# Patient Record
Sex: Female | Born: 1975
Health system: Southern US, Community
[De-identification: ages and names within clinical notes are randomized; demographics above are authoritative.]

## PROBLEM LIST (undated history)

## (undated) DIAGNOSIS — F17200 Nicotine dependence, unspecified, uncomplicated: Secondary | ICD-10-CM

## (undated) DIAGNOSIS — A64 Unspecified sexually transmitted disease: Secondary | ICD-10-CM

## (undated) DIAGNOSIS — G43909 Migraine, unspecified, not intractable, without status migrainosus: Secondary | ICD-10-CM

## (undated) DIAGNOSIS — A749 Chlamydial infection, unspecified: Secondary | ICD-10-CM

## (undated) DIAGNOSIS — IMO0002 Reserved for concepts with insufficient information to code with codable children: Secondary | ICD-10-CM

## (undated) DIAGNOSIS — J45909 Unspecified asthma, uncomplicated: Secondary | ICD-10-CM

## (undated) HISTORY — DX: Nicotine dependence, unspecified, uncomplicated: F17.200

## (undated) HISTORY — PX: TONSILLECTOMY AND ADENOIDECTOMY: SUR1326

## (undated) HISTORY — DX: Unspecified sexually transmitted disease: A64

## (undated) HISTORY — DX: Chlamydial infection, unspecified: A74.9

## (undated) HISTORY — PX: SHOULDER SURGERY: SHX246

## (undated) HISTORY — DX: Reserved for concepts with insufficient information to code with codable children: IMO0002

## (undated) HISTORY — PX: CERVICAL BIOPSY  W/ LOOP ELECTRODE EXCISION: SUR135

---

## 2000-05-19 HISTORY — PX: LEEP: SHX91

## 2003-05-31 ENCOUNTER — Other Ambulatory Visit: Admission: RE | Admit: 2003-05-31 | Discharge: 2003-05-31 | Payer: Self-pay | Admitting: Obstetrics and Gynecology

## 2003-12-06 ENCOUNTER — Other Ambulatory Visit: Admission: RE | Admit: 2003-12-06 | Discharge: 2003-12-06 | Payer: Self-pay | Admitting: Gynecology

## 2004-02-03 ENCOUNTER — Emergency Department (HOSPITAL_COMMUNITY): Admission: EM | Admit: 2004-02-03 | Discharge: 2004-02-03 | Payer: Self-pay | Admitting: Family Medicine

## 2004-12-06 ENCOUNTER — Other Ambulatory Visit: Admission: RE | Admit: 2004-12-06 | Discharge: 2004-12-06 | Payer: Self-pay | Admitting: Gynecology

## 2005-03-31 ENCOUNTER — Emergency Department (HOSPITAL_COMMUNITY): Admission: EM | Admit: 2005-03-31 | Discharge: 2005-03-31 | Payer: Self-pay | Admitting: Family Medicine

## 2005-12-10 ENCOUNTER — Other Ambulatory Visit: Admission: RE | Admit: 2005-12-10 | Discharge: 2005-12-10 | Payer: Self-pay | Admitting: Obstetrics and Gynecology

## 2006-04-03 ENCOUNTER — Emergency Department (HOSPITAL_COMMUNITY): Admission: EM | Admit: 2006-04-03 | Discharge: 2006-04-03 | Payer: Self-pay | Admitting: Family Medicine

## 2006-12-17 ENCOUNTER — Other Ambulatory Visit: Admission: RE | Admit: 2006-12-17 | Discharge: 2006-12-17 | Payer: Self-pay | Admitting: Gynecology

## 2007-07-07 ENCOUNTER — Emergency Department (HOSPITAL_COMMUNITY): Admission: EM | Admit: 2007-07-07 | Discharge: 2007-07-07 | Payer: Self-pay | Admitting: Emergency Medicine

## 2009-07-21 ENCOUNTER — Emergency Department (HOSPITAL_COMMUNITY): Admission: EM | Admit: 2009-07-21 | Discharge: 2009-07-21 | Payer: Self-pay | Admitting: Family Medicine

## 2009-10-17 ENCOUNTER — Other Ambulatory Visit: Admission: RE | Admit: 2009-10-17 | Discharge: 2009-10-17 | Payer: Self-pay | Admitting: Gynecology

## 2009-10-17 ENCOUNTER — Ambulatory Visit: Payer: Self-pay | Admitting: Gynecology

## 2009-10-17 DIAGNOSIS — A749 Chlamydial infection, unspecified: Secondary | ICD-10-CM

## 2009-10-17 HISTORY — DX: Chlamydial infection, unspecified: A74.9

## 2009-12-06 ENCOUNTER — Ambulatory Visit: Payer: Self-pay | Admitting: Gynecology

## 2009-12-17 HISTORY — PX: INTRAUTERINE DEVICE INSERTION: SHX323

## 2010-01-01 ENCOUNTER — Ambulatory Visit: Payer: Self-pay | Admitting: Gynecology

## 2010-01-04 ENCOUNTER — Ambulatory Visit: Payer: Self-pay | Admitting: Gynecology

## 2010-04-06 ENCOUNTER — Emergency Department (HOSPITAL_COMMUNITY): Admission: EM | Admit: 2010-04-06 | Discharge: 2010-04-06 | Payer: Self-pay | Admitting: Emergency Medicine

## 2010-09-16 ENCOUNTER — Ambulatory Visit (INDEPENDENT_AMBULATORY_CARE_PROVIDER_SITE_OTHER): Payer: BC Managed Care – PPO | Admitting: Gynecology

## 2010-09-16 DIAGNOSIS — N898 Other specified noninflammatory disorders of vagina: Secondary | ICD-10-CM

## 2010-09-16 DIAGNOSIS — B373 Candidiasis of vulva and vagina: Secondary | ICD-10-CM

## 2010-09-16 DIAGNOSIS — Z113 Encounter for screening for infections with a predominantly sexual mode of transmission: Secondary | ICD-10-CM

## 2010-10-02 ENCOUNTER — Inpatient Hospital Stay (INDEPENDENT_AMBULATORY_CARE_PROVIDER_SITE_OTHER)
Admission: RE | Admit: 2010-10-02 | Discharge: 2010-10-02 | Disposition: A | Discharge status: Home / Self Care | Payer: BC Managed Care – PPO | Source: Ambulatory Visit | Attending: Emergency Medicine | Admitting: Emergency Medicine

## 2010-10-02 DIAGNOSIS — N390 Urinary tract infection, site not specified: Secondary | ICD-10-CM

## 2010-10-02 LAB — POCT URINALYSIS DIP (DEVICE)
Bilirubin Urine: NEGATIVE
Ketones, ur: NEGATIVE mg/dL
pH: 6.5 (ref 5.0–8.0)

## 2010-10-02 LAB — WET PREP, GENITAL: Trich, Wet Prep: NONE SEEN

## 2010-10-03 LAB — GC/CHLAMYDIA PROBE AMP, GENITAL
Chlamydia, DNA Probe: NEGATIVE
GC Probe Amp, Genital: NEGATIVE

## 2010-10-05 LAB — URINE CULTURE: Culture  Setup Time: 201205161800

## 2010-11-04 ENCOUNTER — Other Ambulatory Visit: Payer: Self-pay | Admitting: Gynecology

## 2010-11-04 ENCOUNTER — Other Ambulatory Visit (HOSPITAL_COMMUNITY)
Admission: RE | Admit: 2010-11-04 | Discharge: 2010-11-04 | Disposition: A | Discharge status: Home / Self Care | Payer: BC Managed Care – PPO | Source: Ambulatory Visit | Attending: Gynecology | Admitting: Gynecology

## 2010-11-04 ENCOUNTER — Encounter (INDEPENDENT_AMBULATORY_CARE_PROVIDER_SITE_OTHER): Payer: BC Managed Care – PPO | Admitting: Gynecology

## 2010-11-04 DIAGNOSIS — Z13220 Encounter for screening for lipoid disorders: Secondary | ICD-10-CM

## 2010-11-04 DIAGNOSIS — R82998 Other abnormal findings in urine: Secondary | ICD-10-CM

## 2010-11-04 DIAGNOSIS — Z124 Encounter for screening for malignant neoplasm of cervix: Secondary | ICD-10-CM | POA: Insufficient documentation

## 2010-11-04 DIAGNOSIS — Z01419 Encounter for gynecological examination (general) (routine) without abnormal findings: Secondary | ICD-10-CM

## 2010-11-17 DIAGNOSIS — IMO0002 Reserved for concepts with insufficient information to code with codable children: Secondary | ICD-10-CM

## 2010-11-17 HISTORY — DX: Reserved for concepts with insufficient information to code with codable children: IMO0002

## 2010-11-17 HISTORY — PX: COLPOSCOPY: SHX161

## 2010-11-19 ENCOUNTER — Other Ambulatory Visit: Payer: Self-pay | Admitting: Gynecology

## 2010-11-19 ENCOUNTER — Ambulatory Visit (INDEPENDENT_AMBULATORY_CARE_PROVIDER_SITE_OTHER): Payer: BC Managed Care – PPO | Admitting: Gynecology

## 2010-11-19 DIAGNOSIS — N870 Mild cervical dysplasia: Secondary | ICD-10-CM

## 2011-01-08 ENCOUNTER — Ambulatory Visit (INDEPENDENT_AMBULATORY_CARE_PROVIDER_SITE_OTHER): Payer: BC Managed Care – PPO | Admitting: Gynecology

## 2011-01-08 ENCOUNTER — Encounter: Payer: Self-pay | Admitting: Gynecology

## 2011-01-08 DIAGNOSIS — B373 Candidiasis of vulva and vagina: Secondary | ICD-10-CM

## 2011-01-08 DIAGNOSIS — F17200 Nicotine dependence, unspecified, uncomplicated: Secondary | ICD-10-CM | POA: Insufficient documentation

## 2011-01-08 DIAGNOSIS — A499 Bacterial infection, unspecified: Secondary | ICD-10-CM

## 2011-01-08 DIAGNOSIS — R82998 Other abnormal findings in urine: Secondary | ICD-10-CM

## 2011-01-08 DIAGNOSIS — B9689 Other specified bacterial agents as the cause of diseases classified elsewhere: Secondary | ICD-10-CM

## 2011-01-08 DIAGNOSIS — R8290 Unspecified abnormal findings in urine: Secondary | ICD-10-CM

## 2011-01-08 DIAGNOSIS — N879 Dysplasia of cervix uteri, unspecified: Secondary | ICD-10-CM | POA: Insufficient documentation

## 2011-01-08 DIAGNOSIS — B3731 Acute candidiasis of vulva and vagina: Secondary | ICD-10-CM

## 2011-01-08 DIAGNOSIS — N898 Other specified noninflammatory disorders of vagina: Secondary | ICD-10-CM

## 2011-01-08 DIAGNOSIS — N760 Acute vaginitis: Secondary | ICD-10-CM

## 2011-01-08 MED ORDER — METRONIDAZOLE 500 MG PO TABS: 500.0000 mg | ORAL_TABLET | Freq: Two times a day (BID) | ORAL | Status: AC

## 2011-01-08 MED ORDER — FLUCONAZOLE 200 MG PO TABS: 200.0000 mg | ORAL_TABLET | Freq: Every day | ORAL | Status: AC

## 2011-01-08 NOTE — Progress Notes (Signed)
Patient presents with a one-week history of vaginal discharge odor also some odor in her urine. She took one Diflucan that she had at home and it did not seem to help her. No fevers chills or constitutional symptoms no frequency dysuria. Does not feel she is at risk for STDs and does not want screened.  Exam Abdomen: Soft nontender without masses guarding rebound organomegaly Pelvic: External BUS vagina White discharge KOH wet prep done, cervix normal IUD string visualized, uterus normal size midline mobile nontender adnexa without masses or tenderness  Assessment and plan: #1 Vaginal discharge. KOH wet prep is positive for yeast and BV we'll treat with Diflucan 200 daily for 3 days and Flagyl 500 twice a day x7 days. Follow up if symptoms persist or recur. #2 Urinary odor. We'll check urinalysis and culture. Followup and treat according to results.

## 2011-01-09 ENCOUNTER — Other Ambulatory Visit: Payer: Self-pay | Admitting: *Deleted

## 2011-01-09 ENCOUNTER — Other Ambulatory Visit: Payer: BC Managed Care – PPO | Admitting: *Deleted

## 2011-01-09 DIAGNOSIS — R8271 Bacteriuria: Secondary | ICD-10-CM

## 2011-01-14 ENCOUNTER — Telehealth: Payer: Self-pay | Admitting: *Deleted

## 2011-01-14 DIAGNOSIS — N390 Urinary tract infection, site not specified: Secondary | ICD-10-CM

## 2011-01-14 MED ORDER — SULFAMETHOXAZOLE-TRIMETHOPRIM 800-160 MG PO TABS: 1.0000 | ORAL_TABLET | Freq: Two times a day (BID) | ORAL | Status: AC

## 2011-01-14 NOTE — Telephone Encounter (Signed)
PT C/O UTI S/S LOWER BACK PAIN, LOW GRADE FEVER, FOUL URINE SMELL. PT WOULD LIKE RX TO RELIEVE. PLEASE ADVISE,

## 2011-01-14 NOTE — Telephone Encounter (Signed)
Pt informed of rx & to make appointment if s/s continue.

## 2011-01-14 NOTE — Telephone Encounter (Signed)
Septra DS 1 po bid X 3.  OV if S/S persist

## 2011-01-27 ENCOUNTER — Telehealth: Payer: Self-pay | Admitting: *Deleted

## 2011-01-27 NOTE — Telephone Encounter (Signed)
Pt calling c/o uti s/s lower back pain, frequent urination, slight burning and slight odor. Pt was given rx on 01/11/11 for uti and told office visit if s/s persist. Office visit offered she states she can't come in due to her work schedule. Pt would like rx for uti. Pt also states she would like something for bladder spasms. I told pt that im not sure if she would get something for that. Please advise.

## 2011-01-27 NOTE — Telephone Encounter (Signed)
Lm on pt vm with the below note. 

## 2011-01-27 NOTE — Telephone Encounter (Signed)
Please call pt ov best to check uti  Or vag infection.  thanks

## 2011-02-23 ENCOUNTER — Inpatient Hospital Stay (INDEPENDENT_AMBULATORY_CARE_PROVIDER_SITE_OTHER)
Admission: RE | Admit: 2011-02-23 | Discharge: 2011-02-23 | Disposition: A | Discharge status: Home / Self Care | Payer: BC Managed Care – PPO | Source: Ambulatory Visit | Attending: Family Medicine | Admitting: Family Medicine

## 2011-02-23 DIAGNOSIS — N390 Urinary tract infection, site not specified: Secondary | ICD-10-CM

## 2011-02-23 LAB — POCT URINALYSIS DIP (DEVICE)
Glucose, UA: NEGATIVE mg/dL
Ketones, ur: NEGATIVE mg/dL
Specific Gravity, Urine: 1.025 (ref 1.005–1.030)
Urobilinogen, UA: 0.2 mg/dL (ref 0.0–1.0)

## 2011-02-23 LAB — POCT PREGNANCY, URINE: Preg Test, Ur: NEGATIVE

## 2011-03-19 ENCOUNTER — Ambulatory Visit (INDEPENDENT_AMBULATORY_CARE_PROVIDER_SITE_OTHER): Payer: BC Managed Care – PPO | Admitting: Gynecology

## 2011-03-19 ENCOUNTER — Encounter: Payer: Self-pay | Admitting: Gynecology

## 2011-03-19 DIAGNOSIS — B373 Candidiasis of vulva and vagina: Secondary | ICD-10-CM

## 2011-03-19 DIAGNOSIS — R350 Frequency of micturition: Secondary | ICD-10-CM

## 2011-03-19 DIAGNOSIS — N898 Other specified noninflammatory disorders of vagina: Secondary | ICD-10-CM

## 2011-03-19 DIAGNOSIS — N760 Acute vaginitis: Secondary | ICD-10-CM

## 2011-03-19 DIAGNOSIS — A499 Bacterial infection, unspecified: Secondary | ICD-10-CM

## 2011-03-19 MED ORDER — METRONIDAZOLE 500 MG PO TABS: 500.0000 mg | ORAL_TABLET | Freq: Two times a day (BID) | ORAL | Status: AC

## 2011-03-19 MED ORDER — TERCONAZOLE 0.8 % VA CREA: 1.0000 | TOPICAL_CREAM | Freq: Every day | VAGINAL | Status: AC

## 2011-03-19 NOTE — Patient Instructions (Signed)
Take medications as prescribed

## 2011-03-19 NOTE — Progress Notes (Signed)
Patient presents complaining of a vaginal discharge and odor some low back pain and urinary frequency. Was treated in August for BV and yeast symptoms got better.  Exam abdomen soft nontender without masses guarding rebound organomegaly Pelvic external BUS vagina with frothy white discharge KOH wet prep done bimanual without masses or tenderness  Assessment and plan: Wet prep is positive for yeast and BV we'll treat with Terazol 3 day cream, Flagyl 500 twice a day x7 days alcohol avoidance, follow up if symptoms recur. Her UA is negative.

## 2011-04-19 ENCOUNTER — Emergency Department (HOSPITAL_COMMUNITY)
Admission: EM | Admit: 2011-04-19 | Discharge: 2011-04-19 | Disposition: A | Discharge status: Other Acute Inpatient Hospital | Payer: BC Managed Care – PPO | Source: Home / Self Care

## 2011-04-19 ENCOUNTER — Encounter (HOSPITAL_COMMUNITY): Payer: Self-pay | Admitting: Emergency Medicine

## 2011-04-19 DIAGNOSIS — J019 Acute sinusitis, unspecified: Secondary | ICD-10-CM

## 2011-04-19 MED ORDER — AMOXICILLIN-POT CLAVULANATE 875-125 MG PO TABS: 1.0000 | ORAL_TABLET | Freq: Two times a day (BID) | ORAL | Status: AC

## 2011-04-19 NOTE — ED Notes (Signed)
Congestion, cough, fever, pressure in chest for 6 days. Progressively worsening.

## 2011-04-19 NOTE — ED Provider Notes (Signed)
History     CSN: [id number] Arrival date & time: 04/19/2011  3:30 PM   None     Chief Complaint  Patient presents with  . Nasal Congestion    (Consider location/radiation/quality/duration/timing/severity/associated sxs/prior treatment) HPI Comments: Pt states symptoms began 8 days ago. Was like a cold at onset with nasal congestion and sneezing. Symptoms have worsened and in last 2-3 days has developed sinus pressure and thick yellow mucus. Fever began yesterday. Minimal cough and feels like is due to post nasal drainage. No chest congestion. Has been taking over the counter cold medications without improvement.   Patient is a 35 y.o. female presenting with sinusitis. The history is provided by the patient.  Sinusitis  This is a new problem. The current episode started more than 1 week ago. The problem has been gradually worsening. The maximum temperature recorded prior to her arrival was 101 to 101.9 F. The fever has been present for 1 to 2 days. The pain is mild. Associated symptoms include chills, congestion, sinus pressure and cough (mild cough, due to post nasal drainage). Pertinent negatives include no ear pain, no sore throat, no swollen glands and no shortness of breath.    Past Medical History  Diagnosis Date  . Chlamydia 10/2009  . Smoker   . LGSIL (low grade squamous intraepithelial dysplasia) 11/2009    Past Surgical History  Procedure Date  . Tonsillectomy and adenoidectomy   . Leep 2002  . Intrauterine device insertion 12/2009  . Colposcopy 11/2010    Family History  Problem Relation Age of Onset  . Diabetes Mother   . Hypertension Mother   . Diabetes Father   . Diabetes Paternal Grandmother   . Hypertension Paternal Grandmother     History  Substance Use Topics  . Smoking status: Current Everyday Smoker -- 0.5 packs/day  . Smokeless tobacco: Never Used  . Alcohol Use: Yes     occassionally    OB History    Grav Para Term Preterm Abortions TAB SAB Ect  Mult Living   1 1              Review of Systems  Constitutional: Positive for fever and chills. Negative for fatigue.  HENT: Positive for congestion, postnasal drip and sinus pressure. Negative for ear pain, sore throat and rhinorrhea.   Respiratory: Positive for cough (mild cough, due to post nasal drainage). Negative for shortness of breath.   Cardiovascular: Negative for chest pain.    Allergies  Review of patient's allergies indicates no known allergies.  Home Medications   Current Outpatient Rx  Name Route Sig Dispense Refill  . AMOXICILLIN-POT CLAVULANATE 875-125 MG PO TABS Oral Take 1 tablet by mouth 2 (two) times daily. 20 tablet 0  . LEVONORGESTREL 20 MCG/24HR IU IUD Intrauterine 1 each by Intrauterine route once. Inserted 01/01/10     . ONE-DAILY MULTI VITAMINS PO TABS Oral Take 1 tablet by mouth daily.        BP 112/68  Pulse 62  Temp(Src) 98.6 F (37 C) (Oral)  Resp 17  SpO2 99%  Physical Exam  Nursing note and vitals reviewed. Constitutional: She appears well-developed and well-nourished. No distress.  HENT:  Head: Normocephalic and atraumatic.  Right Ear: Tympanic membrane, external ear and ear canal normal.  Left Ear: Tympanic membrane, external ear and ear canal normal.  Nose: Mucosal edema present. Right sinus exhibits maxillary sinus tenderness and frontal sinus tenderness. Left sinus exhibits maxillary sinus tenderness and frontal sinus tenderness.  Mouth/Throat: Uvula is midline, oropharynx is clear and moist and mucous membranes are normal. No oropharyngeal exudate, posterior oropharyngeal edema or posterior oropharyngeal erythema.  Neck: Neck supple.  Cardiovascular: Normal rate, regular rhythm and normal heart sounds.   Pulmonary/Chest: Effort normal and breath sounds normal. No respiratory distress.  Lymphadenopathy:    She has no cervical adenopathy.  Neurological: She is alert.  Skin: Skin is warm and dry.  Psychiatric: She has a normal mood  and affect.    ED Course  Procedures (including critical care time)  Labs Reviewed - No data to display No results found.   1. Sinusitis acute       MDM          Melody Comas, PA 04/19/11 1552

## 2011-04-25 NOTE — ED Provider Notes (Signed)
Medical screening examination/treatment/procedure(s) were performed by non-physician practitioner and as supervising physician I was immediately available for consultation/collaboration.  Luiz Blare MD   Luiz Blare, MD 04/25/11 [phone]

## 2011-06-04 ENCOUNTER — Encounter: Payer: Self-pay | Admitting: Gynecology

## 2011-06-04 ENCOUNTER — Ambulatory Visit (INDEPENDENT_AMBULATORY_CARE_PROVIDER_SITE_OTHER): Payer: BC Managed Care – PPO | Admitting: Gynecology

## 2011-06-04 ENCOUNTER — Other Ambulatory Visit (HOSPITAL_COMMUNITY)
Admission: RE | Admit: 2011-06-04 | Discharge: 2011-06-04 | Disposition: A | Discharge status: Home / Self Care | Payer: BC Managed Care – PPO | Source: Ambulatory Visit | Attending: Gynecology | Admitting: Gynecology

## 2011-06-04 DIAGNOSIS — Z01419 Encounter for gynecological examination (general) (routine) without abnormal findings: Secondary | ICD-10-CM | POA: Insufficient documentation

## 2011-06-04 DIAGNOSIS — L293 Anogenital pruritus, unspecified: Secondary | ICD-10-CM

## 2011-06-04 DIAGNOSIS — N949 Unspecified condition associated with female genital organs and menstrual cycle: Secondary | ICD-10-CM

## 2011-06-04 DIAGNOSIS — L292 Pruritus vulvae: Secondary | ICD-10-CM

## 2011-06-04 DIAGNOSIS — N898 Other specified noninflammatory disorders of vagina: Secondary | ICD-10-CM

## 2011-06-04 DIAGNOSIS — N870 Mild cervical dysplasia: Secondary | ICD-10-CM

## 2011-06-04 DIAGNOSIS — Z30431 Encounter for routine checking of intrauterine contraceptive device: Secondary | ICD-10-CM

## 2011-06-04 DIAGNOSIS — R102 Pelvic and perineal pain: Secondary | ICD-10-CM

## 2011-06-04 DIAGNOSIS — B373 Candidiasis of vulva and vagina: Secondary | ICD-10-CM

## 2011-06-04 LAB — URINALYSIS W MICROSCOPIC + REFLEX CULTURE
Hgb urine dipstick: NEGATIVE
Leukocytes, UA: NEGATIVE
Nitrite: NEGATIVE
Protein, ur: NEGATIVE mg/dL
Urobilinogen, UA: 0.2 mg/dL (ref 0.0–1.0)
pH: 7 (ref 5.0–8.0)

## 2011-06-04 LAB — WET PREP FOR TRICH, YEAST, CLUE: Clue Cells Wet Prep HPF POC: NONE SEEN

## 2011-06-04 MED ORDER — FLUCONAZOLE 150 MG PO TABS: 150.0000 mg | ORAL_TABLET | Freq: Once | ORAL | Status: AC

## 2011-06-04 NOTE — Progress Notes (Signed)
Patient presents with 3 issues 1. Repeat Pap smear. She had low-grade SIL in July with colposcopy and biopsy showing low-grade SIL this is her first follow up Pap smear. 2. Notes a vaginal itching without significant discharge. 3. For last 2 months she's had 2 episodes of pelvic cramping around the time when she would think she was to start her period but she doesn't bleed with her Mirena IUD. No cramping in between or other symptoms.  Exam with Sherri chaperone present Abdomen soft nontender without masses guarding organomegaly Pelvic external BUS vagina with whitish discharge. Cervix normal with IUD string visualized Pap done. Uterus normal size midline mobile nontender. Adnexa without masses or tenderness.  Assessment and plan: 1. Low-grade SIL. Pap done if low-grade are normal repeat in 6 months she's due for her annual. If high-grade femoral triage based on results. 2. Vaginal itching. I looked at the Wet prep myself and it is positive for yeast. We'll treat with Diflucan 150x1 dose follow up if symptoms persist or recur. 3. Pelvic cramping x2 episodes. Her exam is normal. She does have an IUD in place. Discussed options. Ultrasound now brought nonpalpable abnormalities versus observing and if it continues of the next cycle or so then call and do ultrasound at that point. Patient's comfortable waiting she will call if she has recurrence of his pain. I did check a UA today.

## 2011-06-04 NOTE — Patient Instructions (Signed)
Follow up for Pap smear results. Assuming normal or low grade and will repeat in 6 months when you're due for your annual exam. Take Diflucan medication and if itching continues call. If you have persistence of her pelvic cramping call we'll schedule ultrasound.

## 2011-07-17 ENCOUNTER — Emergency Department (HOSPITAL_COMMUNITY)
Admission: EM | Admit: 2011-07-17 | Discharge: 2011-07-17 | Disposition: A | Discharge status: Home / Self Care | Payer: BC Managed Care – PPO | Source: Home / Self Care | Attending: Emergency Medicine | Admitting: Emergency Medicine

## 2011-07-17 ENCOUNTER — Encounter (HOSPITAL_COMMUNITY): Payer: Self-pay | Admitting: Emergency Medicine

## 2011-07-17 DIAGNOSIS — N10 Acute tubulo-interstitial nephritis: Secondary | ICD-10-CM

## 2011-07-17 LAB — POCT URINALYSIS DIP (DEVICE)
Bilirubin Urine: NEGATIVE
Glucose, UA: NEGATIVE mg/dL
Hgb urine dipstick: NEGATIVE
Specific Gravity, Urine: 1.02 (ref 1.005–1.030)
Urobilinogen, UA: 2 mg/dL — ABNORMAL HIGH (ref 0.0–1.0)

## 2011-07-17 MED ORDER — CIPROFLOXACIN HCL 500 MG PO TABS: 500.0000 mg | ORAL_TABLET | Freq: Two times a day (BID) | ORAL | Status: AC

## 2011-07-17 MED ORDER — HYDROCODONE-ACETAMINOPHEN 5-325 MG PO TABS
ORAL_TABLET | ORAL | Status: AC
  Filled 2011-07-17: qty 1

## 2011-07-17 MED ORDER — HYDROCODONE-ACETAMINOPHEN 5-325 MG PO TABS: ORAL_TABLET | ORAL | Status: AC

## 2011-07-17 MED ORDER — ONDANSETRON 4 MG PO TBDP
8.0000 mg | ORAL_TABLET | Freq: Once | ORAL | Status: AC
  Administered 2011-07-17: 8 mg via ORAL

## 2011-07-17 MED ORDER — CEFTRIAXONE SODIUM 1 G IJ SOLR
INTRAMUSCULAR | Status: AC
  Filled 2011-07-17: qty 10

## 2011-07-17 MED ORDER — LIDOCAINE HCL (PF) 1 % IJ SOLN
INTRAMUSCULAR | Status: AC
  Filled 2011-07-17: qty 5

## 2011-07-17 MED ORDER — ONDANSETRON 4 MG PO TBDP
ORAL_TABLET | ORAL | Status: AC
  Filled 2011-07-17: qty 2

## 2011-07-17 MED ORDER — CEFTRIAXONE SODIUM 1 G IJ SOLR
1.0000 g | Freq: Once | INTRAMUSCULAR | Status: AC
  Administered 2011-07-17: 1 g via INTRAMUSCULAR

## 2011-07-17 MED ORDER — HYDROCODONE-ACETAMINOPHEN 5-325 MG PO TABS
1.0000 | ORAL_TABLET | Freq: Once | ORAL | Status: AC
  Administered 2011-07-17: 1 via ORAL

## 2011-07-17 MED ORDER — ONDANSETRON 8 MG PO TBDP: 8.0000 mg | ORAL_TABLET | Freq: Three times a day (TID) | ORAL | Status: AC | PRN

## 2011-07-17 NOTE — ED Provider Notes (Signed)
Chief Complaint  Patient presents with  . Back Pain    History of Present Illness:   Lori Duncan is a 36 year old female. She has had frequent urinary tract infections in the past. Today she developed right CVA, right flank pain, with radiation into the pelvic area. She felt somewhat nauseated but did not vomit. She denies fever or chills. She hasn't had any specific urinary symptoms, but states that she does not have the typical symptoms when she gets her urinary tract infections in the past. She usually just gets the lower back pain. She denies dysuria, frequency, urgency, or hematuria. She's had no GYN complaints.  Review of Systems:  Other than noted above, the patient denies any of the following symptoms: General:  No fevers, chills, sweats, aches, or fatigue. GI:  No abdominal pain, back pain, nausea, vomiting, diarrhea, or constipation. GU:  No dysuria, frequency, urgency, hematuria, or incontinence. GYN:  No discharge, itching, vulvar pain or lesions, pelvic pain, or abnormal vaginal bleeding.  PMFSH:  Past medical history, family history, social history, meds, and allergies were reviewed.  Physical Exam:   Vital signs:  There were no vitals taken for this visit. Gen:  Alert, oriented, in no distress. Lungs:  Clear to auscultation, no wheezes, rales or rhonchi. Heart:  Regular rhythm, no gallop or murmer. Abdomen:  Flat and soft. There was slight right flank pain to palpation.  No guarding, or rebound.  No hepato-splenomegaly or mass.  Bowel sounds were normally active.  No hernia. Back:  She has marked right CVA tenderness.  Skin:  Clear, warm and dry.  Labs:   Results for orders placed during the hospital encounter of 07/17/11  POCT URINALYSIS DIP (DEVICE)      Component Value Range   Glucose, UA NEGATIVE  NEGATIVE (mg/dL)   Bilirubin Urine NEGATIVE  NEGATIVE    Ketones, ur NEGATIVE  NEGATIVE (mg/dL)   Specific Gravity, Urine 1.020  1.005 - 1.030    Hgb urine dipstick NEGATIVE   NEGATIVE    pH 6.5  5.0 - 8.0    Protein, ur NEGATIVE  NEGATIVE (mg/dL)   Urobilinogen, UA 2.0 (*) 0.0 - 1.0 (mg/dL)   Nitrite NEGATIVE  NEGATIVE    Leukocytes, UA NEGATIVE  NEGATIVE   POCT PREGNANCY, URINE      Component Value Range   Preg Test, Ur NEGATIVE  NEGATIVE      Assessment:  Diagnoses that have been ruled out:  None  Diagnoses that are still under consideration:  Appendicitis   None  Final diagnoses:  Acute pyelonephritis   Course in Urgent Care Center:   She was given Rocephin 1 g IM, Norco 5/325 by mouth, and Zofran ODT 8 mg sublingually. She tolerated these meds without any immediate side effects.   Plan:   1.  The following meds were prescribed:   New Prescriptions   CIPROFLOXACIN (CIPRO) 500 MG TABLET    Take 1 tablet (500 mg total) by mouth every 12 (twelve) hours.   HYDROCODONE-ACETAMINOPHEN (NORCO) 5-325 MG PER TABLET    1 to 2 tabs every 4 to 6 hours as needed for pain.   ONDANSETRON (ZOFRAN ODT) 8 MG DISINTEGRATING TABLET    Take 1 tablet (8 mg total) by mouth every 8 (eight) hours as needed for nausea.   2.  The patient was instructed in symptomatic care and handouts were given. 3.  The patient was told to return if becoming worse in any way, if no better in 3 or  4 days, and given some red flag symptoms that would indicate earlier return. 4.  The patient was told to avoid intercourse for 10 days, get extra fluids, and return for a follow up with her primary care doctor at the completion of treatment for a repeat UA and culture.  5.  She  was told that if her symptoms should worsen that the best course would be to go to the hospital emergency room for further evaluation and treatment.    Roque Lias, MD 07/17/11 2045

## 2011-07-17 NOTE — Discharge Instructions (Signed)
Pyelonephritis, Adult Pyelonephritis is a kidney infection. In general, there are 2 main types of pyelonephritis:  Infections that come on quickly without any warning (acute pyelonephritis).   Infections that persist for a long period of time (chronic pyelonephritis).  CAUSES  Two main causes of pyelonephritis are:  Bacteria traveling from the bladder to the kidney. This is a problem especially in pregnant women. The urine in the bladder can become filled with bacteria from multiple causes, including:   Inflammation of the prostate gland (prostatitis).   Sexual intercourse in females.   Bladder infection (cystitis).   Bacteria traveling from the bloodstream to the tissue part of the kidney.  Problems that may increase your risk of getting a kidney infection include:  Diabetes.   Kidney stones or bladder stones.   Cancer.   Catheters placed in the bladder.   Other abnormalities of the kidney or ureter.  SYMPTOMS   Abdominal pain.   Pain in the side or flank area.   Fever.   Chills.   Upset stomach.   Blood in the urine (dark urine).   Frequent urination.   Strong or persistent urge to urinate.   Burning or stinging when urinating.  DIAGNOSIS  Your caregiver may diagnose your kidney infection based on your symptoms. A urine sample may also be taken. TREATMENT  In general, treatment depends on how severe the infection is.   If the infection is mild and caught early, your caregiver may treat you with oral antibiotics and send you home.   If the infection is more severe, the bacteria may have gotten into the bloodstream. This will require intravenous (IV) antibiotics and a hospital stay. Symptoms may include:   High fever.   Severe flank pain.   Shaking chills.   Even after a hospital stay, your caregiver may require you to be on oral antibiotics for a period of time.   Other treatments may be required depending upon the cause of the infection.  HOME CARE  INSTRUCTIONS   Take your antibiotics as directed. Finish them even if you start to feel better.   Make an appointment to have your urine checked to make sure the infection is gone.   Drink enough fluids to keep your urine clear or pale yellow.   Take medicines for the bladder if you have urgency and frequency of urination as directed by your caregiver.  SEEK IMMEDIATE MEDICAL CARE IF:   You have a fever.   You are unable to take your antibiotics or fluids.   You develop shaking chills.   You experience extreme weakness or fainting.   There is no improvement after 2 days of treatment.  MAKE SURE YOU:  Understand these instructions.   Will watch your condition.   Will get help right away if you are not doing well or get worse.  Document Released: 05/05/2005 Document Revised: 01/15/2011 Document Reviewed: 10/09/2010 [employer] Patient Information 2012 [employer]. 

## 2011-07-17 NOTE — ED Notes (Signed)
C/o lower back pain, pelvic pain right flank pain, denies any burning with urination, denies frequency.  Patient reports she never has typical s/s of uti.  Patient reports this feels like uti pain, but worse.  Denies vaginal discharge.  Last bowel movement was this am and normal per patient.

## 2011-07-18 LAB — URINE CULTURE
Culture  Setup Time: 201303010217
Culture: NO GROWTH

## 2011-12-03 ENCOUNTER — Emergency Department (HOSPITAL_COMMUNITY): Payer: BC Managed Care – PPO

## 2011-12-03 ENCOUNTER — Emergency Department (HOSPITAL_COMMUNITY)
Admission: EM | Admit: 2011-12-03 | Discharge: 2011-12-03 | Disposition: A | Discharge status: Home / Self Care | Payer: BC Managed Care – PPO | Attending: Emergency Medicine | Admitting: Emergency Medicine

## 2011-12-03 ENCOUNTER — Encounter (HOSPITAL_COMMUNITY): Payer: Self-pay | Admitting: Emergency Medicine

## 2011-12-03 DIAGNOSIS — K529 Noninfective gastroenteritis and colitis, unspecified: Secondary | ICD-10-CM

## 2011-12-03 DIAGNOSIS — F17200 Nicotine dependence, unspecified, uncomplicated: Secondary | ICD-10-CM | POA: Insufficient documentation

## 2011-12-03 DIAGNOSIS — K5289 Other specified noninfective gastroenteritis and colitis: Secondary | ICD-10-CM | POA: Insufficient documentation

## 2011-12-03 LAB — URINALYSIS, ROUTINE W REFLEX MICROSCOPIC
Ketones, ur: NEGATIVE mg/dL
Leukocytes, UA: NEGATIVE
Nitrite: NEGATIVE
Specific Gravity, Urine: 1.025 (ref 1.005–1.030)
pH: 6 (ref 5.0–8.0)

## 2011-12-03 LAB — CBC WITH DIFFERENTIAL/PLATELET
Basophils Relative: 1 % (ref 0–1)
Eosinophils Absolute: 0.4 10*3/uL (ref 0.0–0.7)
Eosinophils Relative: 5 % (ref 0–5)
Hemoglobin: 14.9 g/dL (ref 12.0–15.0)
Lymphs Abs: 1.6 10*3/uL (ref 0.7–4.0)
MCH: 30.9 pg (ref 26.0–34.0)
MCHC: 34.3 g/dL (ref 30.0–36.0)
MCV: 90 fL (ref 78.0–100.0)
Monocytes Relative: 7 % (ref 3–12)
RBC: 4.82 MIL/uL (ref 3.87–5.11)

## 2011-12-03 LAB — COMPREHENSIVE METABOLIC PANEL
Albumin: 4.3 g/dL (ref 3.5–5.2)
BUN: 9 mg/dL (ref 6–23)
Calcium: 8.3 mg/dL — ABNORMAL LOW (ref 8.4–10.5)
Creatinine, Ser: 0.78 mg/dL (ref 0.50–1.10)
GFR calc Af Amer: >90 mL/min (ref >90)
Glucose, Bld: 101 mg/dL — ABNORMAL HIGH (ref 70–99)
Total Protein: 7 g/dL (ref 6.0–8.3)

## 2011-12-03 LAB — OCCULT BLOOD, POC DEVICE: Fecal Occult Bld: POSITIVE

## 2011-12-03 MED ORDER — HYDROMORPHONE HCL PF 1 MG/ML IJ SOLN
1.0000 mg | Freq: Once | INTRAMUSCULAR | Status: AC
  Administered 2011-12-03: 1 mg via INTRAVENOUS
  Filled 2011-12-03: qty 1

## 2011-12-03 MED ORDER — TRAMADOL HCL 50 MG PO TABS: 50.0000 mg | ORAL_TABLET | Freq: Four times a day (QID) | ORAL | Status: AC | PRN

## 2011-12-03 MED ORDER — ONDANSETRON HCL 4 MG/2ML IJ SOLN
4.0000 mg | Freq: Once | INTRAMUSCULAR | Status: AC
  Administered 2011-12-03: 4 mg via INTRAVENOUS
  Filled 2011-12-03: qty 2

## 2011-12-03 MED ORDER — KETOROLAC TROMETHAMINE 30 MG/ML IJ SOLN
30.0000 mg | Freq: Once | INTRAMUSCULAR | Status: AC
  Administered 2011-12-03: 30 mg via INTRAVENOUS
  Filled 2011-12-03: qty 1

## 2011-12-03 MED ORDER — SODIUM CHLORIDE 0.9 % IV BOLUS (SEPSIS)
1000.0000 mL | Freq: Once | INTRAVENOUS | Status: AC
  Administered 2011-12-03: 1000 mL via INTRAVENOUS

## 2011-12-03 MED ORDER — CIPROFLOXACIN HCL 500 MG PO TABS: 500.0000 mg | ORAL_TABLET | Freq: Two times a day (BID) | ORAL | Status: AC

## 2011-12-03 MED ORDER — ONDANSETRON 4 MG PO TBDP: 4.0000 mg | ORAL_TABLET | Freq: Three times a day (TID) | ORAL | Status: AC | PRN

## 2011-12-03 NOTE — ED Provider Notes (Cosign Needed)
History    This chart was scribed for [name], MD, MD by Smitty Pluck. The patient was seen in room APA18 and the patient's care was started at 7:40AM.   CSN: [id number]  Arrival date & time 12/03/11  0725   First MD Initiated Contact with Patient 12/03/11 [phone]      Chief Complaint  Patient presents with  . Diarrhea  . Abdominal Pain    (Consider location/radiation/quality/duration/timing/severity/associated sxs/prior treatment) Patient is a 36 y.o. female presenting with diarrhea and abdominal pain. The history is provided by the patient.  Diarrhea The primary symptoms include abdominal pain and diarrhea. The illness began today. The onset was sudden. The problem has not changed since onset. The abdominal pain began today. The abdominal pain has been unchanged since its onset. The abdominal pain is generalized. The abdominal pain does not radiate.  The diarrhea began today. The diarrhea is bloody. The diarrhea occurs 2 to 4 times per day.  Abdominal Pain The primary symptoms of the illness include abdominal pain and diarrhea.   Lori Duncan is a 36 y.o. female who presents to the Emergency Department complaining of moderate cramping abdominal pain onset this today. Pt reports that pain awoke her from sleep. She reports having diarrhea and blood in her stool. She reports the abdominal pain is intermittent. She reports that she is unsure if she had fever. Denies nausea and emesis.   Past Medical History  Diagnosis Date  . Chlamydia 10/2009  . Smoker   . LGSIL (low grade squamous intraepithelial dysplasia) 11/2010    Past Surgical History  Procedure Date  . Tonsillectomy and adenoidectomy   . Leep 2002  . Intrauterine device insertion 12/2009  . Colposcopy 11/2010    Family History  Problem Relation Age of Onset  . Diabetes Mother   . Hypertension Mother   . Diabetes Father   . Diabetes Paternal Grandmother   . Hypertension Paternal Grandmother      History  Substance Use Topics  . Smoking status: Current Everyday Smoker -- 0.5 packs/day  . Smokeless tobacco: Never Used  . Alcohol Use: Yes     occassionally    OB History    Grav Para Term Preterm Abortions TAB SAB Ect Mult Living   1 1              Review of Systems  Gastrointestinal: Positive for abdominal pain and diarrhea.  All other systems reviewed and are negative.  10 Systems reviewed and all are negative for acute change except as noted in the HPI.    Allergies  Review of patient's allergies indicates no known allergies.  Home Medications   Current Outpatient Rx  Name Route Sig Dispense Refill  . VITAMIN C PO Oral Take by mouth.    . CRANBERRY PO Oral Take by mouth.    Marland Kitchen HYDROCODONE-ACETAMINOPHEN 5-325 MG PO TABS Oral Take 1 tablet by mouth every 6 (six) hours as needed.    . IBUPROFEN 200 MG PO TABS Oral Take 200 mg by mouth every 6 (six) hours as needed.    Marland Kitchen LEVONORGESTREL 20 MCG/24HR IU IUD Intrauterine 1 each by Intrauterine route once. Inserted 01/01/10     . ONE-DAILY MULTI VITAMINS PO TABS Oral Take 1 tablet by mouth daily.        BP 132/76  Pulse 92  Temp 98.4 F (36.9 C) (Oral)  Resp 18  Ht 5\' 7"  (1.702 m)  Wt 170 lb (77.111 kg)  BMI  26.63 kg/m2  SpO2 99%  Physical Exam  Nursing note and vitals reviewed. Constitutional: She is oriented to person, place, and time. She appears well-developed.  HENT:  Head: Normocephalic and atraumatic.  Eyes: Conjunctivae and EOM are normal. No scleral icterus.  Neck: Neck supple. No thyromegaly present.  Cardiovascular: Normal rate and regular rhythm.  Exam reveals no gallop and no friction rub.   No murmur heard. Pulmonary/Chest: No stridor. She has no wheezes. She has no rales. She exhibits no tenderness.  Abdominal: She exhibits no distension. There is generalized tenderness. There is no rebound.  Genitourinary: Rectum normal.       No stool   Musculoskeletal: Normal range of motion. She  exhibits no edema.  Lymphadenopathy:    She has no cervical adenopathy.  Neurological: She is oriented to person, place, and time. Coordination normal.  Skin: No rash noted. No erythema.  Psychiatric: She has a normal mood and affect. Her behavior is normal.    ED Course  Procedures (including critical care time) DIAGNOSTIC STUDIES: Oxygen Saturation is 99% on room air, normal by my interpretation.    COORDINATION OF CARE: 7:45AM EDP discusses pt ED treatment with pt  7:45AM EDP orders medication: NaCl 0.9% bolus, Zofran 4 mg, Toradol 30 mg     Labs Reviewed  COMPREHENSIVE METABOLIC PANEL - Abnormal; Notable for the following:    Glucose, Bld 101 (*)     Calcium 8.3 (*)     All other components within normal limits  CBC WITH DIFFERENTIAL  URINALYSIS, ROUTINE W REFLEX MICROSCOPIC   Dg Abd Acute W/chest  12/03/2011  *RADIOLOGY REPORT*  Clinical Data: 36 year old female with abdominal pain nausea vomiting and diarrhea.  ACUTE ABDOMEN SERIES (ABDOMEN 2 VIEW & CHEST 1 VIEW)  Comparison: None.  Findings: Lung volumes are normal.  The lungs are clear. Normal cardiac size and mediastinal contours.  Visualized tracheal air column is within normal limits.  No pneumothorax or pneumoperitoneum.  Nonobstructed bowel gas pattern.  IUD.  Abdominal and pelvic visceral contours are within normal limits. No osseous abnormality identified.  IMPRESSION: Nonobstructed bowel gas pattern, no free air.  Negative chest.  Original Report Authenticated By: Harley Hallmark, M.D.     No diagnosis found.    MDM  The chart was scribed for me under my direct supervision.  I personally performed the history, physical, and medical decision making and all procedures in the evaluation of this patient.[name], MD 12/03/11 [phone]

## 2011-12-03 NOTE — ED Notes (Signed)
Pt woke up with severe abd cramping and having dirrhea with spots of blood. Pt denies vomiting. Denies history of same.

## 2012-04-16 ENCOUNTER — Encounter (HOSPITAL_COMMUNITY): Payer: Self-pay | Admitting: *Deleted

## 2012-04-16 ENCOUNTER — Emergency Department (HOSPITAL_COMMUNITY): Payer: BC Managed Care – PPO

## 2012-04-16 ENCOUNTER — Emergency Department (HOSPITAL_COMMUNITY)
Admission: EM | Admit: 2012-04-16 | Discharge: 2012-04-16 | Disposition: A | Discharge status: Home / Self Care | Payer: BC Managed Care – PPO | Attending: Emergency Medicine | Admitting: Emergency Medicine

## 2012-04-16 DIAGNOSIS — Y939 Activity, unspecified: Secondary | ICD-10-CM | POA: Insufficient documentation

## 2012-04-16 DIAGNOSIS — Y999 Unspecified external cause status: Secondary | ICD-10-CM | POA: Insufficient documentation

## 2012-04-16 DIAGNOSIS — Z87410 Personal history of cervical dysplasia: Secondary | ICD-10-CM | POA: Insufficient documentation

## 2012-04-16 DIAGNOSIS — M549 Dorsalgia, unspecified: Secondary | ICD-10-CM | POA: Insufficient documentation

## 2012-04-16 DIAGNOSIS — M479 Spondylosis, unspecified: Secondary | ICD-10-CM | POA: Insufficient documentation

## 2012-04-16 DIAGNOSIS — F17200 Nicotine dependence, unspecified, uncomplicated: Secondary | ICD-10-CM | POA: Insufficient documentation

## 2012-04-16 DIAGNOSIS — M25519 Pain in unspecified shoulder: Secondary | ICD-10-CM | POA: Insufficient documentation

## 2012-04-16 DIAGNOSIS — Y92410 Unspecified street and highway as the place of occurrence of the external cause: Secondary | ICD-10-CM | POA: Insufficient documentation

## 2012-04-16 MED ORDER — HYDROCODONE-ACETAMINOPHEN 5-325 MG PO TABS
2.0000 | ORAL_TABLET | Freq: Once | ORAL | Status: AC
  Administered 2012-04-16: 2 via ORAL
  Filled 2012-04-16: qty 2

## 2012-04-16 MED ORDER — ONDANSETRON HCL 4 MG PO TABS
4.0000 mg | ORAL_TABLET | Freq: Once | ORAL | Status: AC
  Administered 2012-04-16: 4 mg via ORAL
  Filled 2012-04-16: qty 1

## 2012-04-16 MED ORDER — MELOXICAM 7.5 MG PO TABS: ORAL_TABLET | ORAL | Status: DC

## 2012-04-16 MED ORDER — METHOCARBAMOL 500 MG PO TABS: ORAL_TABLET | ORAL | Status: DC

## 2012-04-16 MED ORDER — DIAZEPAM 5 MG PO TABS
5.0000 mg | ORAL_TABLET | Freq: Once | ORAL | Status: AC
  Administered 2012-04-16: 5 mg via ORAL
  Filled 2012-04-16: qty 1

## 2012-04-16 MED ORDER — HYDROCODONE-ACETAMINOPHEN 5-325 MG PO TABS: 1.0000 | ORAL_TABLET | ORAL | Status: DC | PRN
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
## 2012-04-16 NOTE — ED Notes (Signed)
EDPa in room with pt. 

## 2012-04-17 NOTE — ED Provider Notes (Signed)
Medical screening examination/treatment/procedure(s) were performed by non-physician practitioner and as supervising physician I was immediately available for consultation/collaboration.  Flint Melter, MD 04/17/12 [phone]

## 2012-04-23 ENCOUNTER — Other Ambulatory Visit (HOSPITAL_COMMUNITY): Payer: Self-pay | Admitting: Family Medicine

## 2012-04-23 ENCOUNTER — Ambulatory Visit (HOSPITAL_COMMUNITY)
Admission: RE | Admit: 2012-04-23 | Discharge: 2012-04-23 | Disposition: A | Discharge status: Home / Self Care | Payer: BC Managed Care – PPO | Source: Ambulatory Visit | Attending: Family Medicine | Admitting: Family Medicine

## 2012-04-23 DIAGNOSIS — M25519 Pain in unspecified shoulder: Secondary | ICD-10-CM | POA: Insufficient documentation

## 2012-04-23 DIAGNOSIS — M25511 Pain in right shoulder: Secondary | ICD-10-CM

## 2012-05-28 ENCOUNTER — Other Ambulatory Visit (HOSPITAL_COMMUNITY)
Admission: RE | Admit: 2012-05-28 | Discharge: 2012-05-28 | Disposition: A | Discharge status: Home / Self Care | Payer: BC Managed Care – PPO | Source: Ambulatory Visit | Attending: Women's Health | Admitting: Women's Health

## 2012-05-28 ENCOUNTER — Encounter: Payer: Self-pay | Admitting: Women's Health

## 2012-05-28 ENCOUNTER — Ambulatory Visit (INDEPENDENT_AMBULATORY_CARE_PROVIDER_SITE_OTHER): Payer: BC Managed Care – PPO | Admitting: Women's Health

## 2012-05-28 VITALS — BP 120/78 | Ht 67.0 in | Wt 180.0 lb

## 2012-05-28 DIAGNOSIS — N898 Other specified noninflammatory disorders of vagina: Secondary | ICD-10-CM

## 2012-05-28 DIAGNOSIS — R350 Frequency of micturition: Secondary | ICD-10-CM

## 2012-05-28 DIAGNOSIS — Z01419 Encounter for gynecological examination (general) (routine) without abnormal findings: Secondary | ICD-10-CM | POA: Insufficient documentation

## 2012-05-28 DIAGNOSIS — N879 Dysplasia of cervix uteri, unspecified: Secondary | ICD-10-CM

## 2012-05-28 DIAGNOSIS — IMO0001 Reserved for inherently not codable concepts without codable children: Secondary | ICD-10-CM

## 2012-05-28 DIAGNOSIS — Z1151 Encounter for screening for human papillomavirus (HPV): Secondary | ICD-10-CM | POA: Insufficient documentation

## 2012-05-28 DIAGNOSIS — F17200 Nicotine dependence, unspecified, uncomplicated: Secondary | ICD-10-CM

## 2012-05-28 LAB — URINALYSIS W MICROSCOPIC + REFLEX CULTURE
Hgb urine dipstick: NEGATIVE
Ketones, ur: 15 mg/dL — AB
Leukocytes, UA: NEGATIVE
Nitrite: NEGATIVE
Specific Gravity, Urine: 1.02 (ref 1.005–1.030)
Urobilinogen, UA: 0.2 mg/dL (ref 0.0–1.0)
pH: 7 (ref 5.0–8.0)

## 2012-05-28 LAB — WET PREP FOR TRICH, YEAST, CLUE

## 2012-05-28 MED ORDER — METRONIDAZOLE 0.75 % VA GEL: VAGINAL | Status: DC

## 2012-05-28 MED ORDER — FLUCONAZOLE 150 MG PO TABS: 150.0000 mg | ORAL_TABLET | Freq: Once | ORAL | Status: DC

## 2012-05-28 NOTE — Assessment & Plan Note (Signed)
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

## 2012-05-28 NOTE — Assessment & Plan Note (Deleted)
Quit October 2013

## 2012-05-28 NOTE — Patient Instructions (Addendum)

## 2012-05-28 NOTE — Progress Notes (Signed)
Makenley Shimp [facility] 08/26/1975 [id number]    History:    The patient presents for annual exam.  Amenorrheic  Mirena IUD placed 12/2009. 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 Normal Pap January 2013. History of LEEP in 2002 for moderate dysplasia, normal Paps after. Quit smoking October 2013 and has gained approximately 20 pounds and is in the process of losing weight now.   Past medical history, past surgical history, family history and social history were all reviewed and documented in the EPIC chart. Works at [employer]. Frederik Schmidt 10 doing well. Both parents with diabetes.  ROS:  A  ROS was performed and pertinent positives and negatives are included in the history.  Exam:  Filed Vitals:   05/28/12 0805  BP: 120/78    General appearance:  Normal Head/Neck:  Normal, without cervical or supraclavicular adenopathy. Thyroid:  Symmetrical, normal in size, without palpable masses or nodularity. Respiratory  Effort:  Normal  Auscultation:  Clear without wheezing or rhonchi Cardiovascular  Auscultation:  Regular rate, without rubs, murmurs or gallops  Edema/varicosities:  Not grossly evident Abdominal  Soft,nontender, without masses, guarding or rebound.  Liver/spleen:  No organomegaly noted  Hernia:  None appreciated  Skin  Inspection:  Grossly normal  Palpation:  Grossly normal Neurologic/psychiatric  Orientation:  Normal with appropriate conversation.  Mood/affect:  Normal  Genitourinary    Breasts: Examined lying and sitting.     Right: Without masses, retractions, discharge or axillary adenopathy.     Left: Without masses, retractions, discharge or axillary adenopathy.   Inguinal/mons:  Normal without inguinal adenopathy  External genitalia:  Normal  BUS/Urethra/Skene's glands:  Normal  Bladder:  Normal  Vagina:  Normal  Cervix:  Normal IUD strings visualized  Uterus:  normal in size, shape and contour.  Midline and mobile  Adnexa/parametria:     Rt: Without masses  or tenderness.   Lt: Without masses or tenderness.  Anus and perineum: Normal  Digital rectal exam: Normal sphincter tone without palpated masses or tenderness  Assessment/Plan:  37 y.o. M. WF G1 P1 for annual exam with complaint of urinary frequency and vaginal discharge with itching.  Mirena IUD 12/2009 History of LGSIL 11/2010 normal Pap January 2013.  BV  Plan: MetroGel vaginal cream 1 applicator at bedtime x5, Diflucan 150 by mouth x1 dose, prescriptions proper use given and reviewed. Instructed to call if no relief of symptoms. SBE's, exercise, calcium rich diet, MVI daily encouraged. UA negative, urine culture pending. Pap, triage based on results, new screening guidelines reviewed. Labs at primary care. Congratulated on smoking cessation and will continue to cut calories for weight loss.   Harrington [facility], 8:34 AM 05/28/2012

## 2012-05-28 NOTE — Addendum Note (Signed)
Addended by: Dayna Barker on: 05/28/2012 08:58 AM   Modules accepted: Orders

## 2012-06-01 LAB — URINE CULTURE: Colony Count: 95000

## 2012-06-02 ENCOUNTER — Other Ambulatory Visit: Payer: Self-pay | Admitting: Women's Health

## 2012-06-02 MED ORDER — NITROFURANTOIN MONOHYD MACRO 100 MG PO CAPS: 100.0000 mg | ORAL_CAPSULE | Freq: Two times a day (BID) | ORAL | Status: DC

## 2012-06-21 ENCOUNTER — Encounter: Payer: Self-pay | Admitting: Women's Health

## 2012-06-21 ENCOUNTER — Ambulatory Visit (INDEPENDENT_AMBULATORY_CARE_PROVIDER_SITE_OTHER): Payer: BC Managed Care – PPO | Admitting: Women's Health

## 2012-06-21 DIAGNOSIS — Z30432 Encounter for removal of intrauterine contraceptive device: Secondary | ICD-10-CM

## 2012-06-21 NOTE — Progress Notes (Signed)
Patient ID: Lori Duncan, female   DOB: 1976-04-02, 37 y.o.   MRN: [id number] Presents for IUD removal. Would like to conceive. Currently on a prenatal vitamin daily. Rare cycle on Mirena IUD that was placed in 2011. Has been smoke-free for 6 months. Aware of safe behaviors in pregnancy.  Exam: Appears well, external genitalia within normal limits, piercing clitoral hood. Speculum exam Mirena IUD strings visualized, IUD grasped with a ring forcep removed intact shown to patient and discarded.   Preconceptual counseling  Plan: Aware we no longer deliver, instructed to call with missed cycle to schedule viability dating ultrasound. Reviewed ovulation/fertility. Reviewed may take several months to start having regular cycles. Congratulated on smoking cessation.

## 2013-05-30 ENCOUNTER — Encounter: Payer: BC Managed Care – PPO | Admitting: Women's Health

## 2014-03-20 ENCOUNTER — Encounter: Payer: Self-pay | Admitting: Women's Health

## 2014-06-05 ENCOUNTER — Other Ambulatory Visit (HOSPITAL_COMMUNITY): Payer: Self-pay | Admitting: Family Medicine

## 2014-06-05 ENCOUNTER — Ambulatory Visit (HOSPITAL_COMMUNITY)
Admission: RE | Admit: 2014-06-05 | Discharge: 2014-06-05 | Disposition: A | Discharge status: Home / Self Care | Payer: BLUE CROSS/BLUE SHIELD | Source: Ambulatory Visit | Attending: Family Medicine | Admitting: Family Medicine

## 2014-06-05 DIAGNOSIS — R05 Cough: Secondary | ICD-10-CM | POA: Diagnosis present

## 2014-06-05 DIAGNOSIS — R509 Fever, unspecified: Secondary | ICD-10-CM | POA: Diagnosis not present

## 2014-06-05 DIAGNOSIS — J209 Acute bronchitis, unspecified: Secondary | ICD-10-CM

## 2014-06-05 DIAGNOSIS — Z87891 Personal history of nicotine dependence: Secondary | ICD-10-CM | POA: Insufficient documentation

## 2014-12-10 ENCOUNTER — Encounter (HOSPITAL_COMMUNITY): Payer: Self-pay | Admitting: *Deleted

## 2014-12-10 ENCOUNTER — Emergency Department (HOSPITAL_COMMUNITY)
Admission: EM | Admit: 2014-12-10 | Discharge: 2014-12-10 | Disposition: A | Discharge status: Home / Self Care | Payer: BLUE CROSS/BLUE SHIELD | Attending: Emergency Medicine | Admitting: Emergency Medicine

## 2014-12-10 ENCOUNTER — Telehealth: Payer: Self-pay | Admitting: *Deleted

## 2014-12-10 DIAGNOSIS — Z87891 Personal history of nicotine dependence: Secondary | ICD-10-CM | POA: Diagnosis not present

## 2014-12-10 DIAGNOSIS — M5441 Lumbago with sciatica, right side: Secondary | ICD-10-CM | POA: Diagnosis not present

## 2014-12-10 DIAGNOSIS — Z791 Long term (current) use of non-steroidal anti-inflammatories (NSAID): Secondary | ICD-10-CM | POA: Insufficient documentation

## 2014-12-10 DIAGNOSIS — Z8619 Personal history of other infectious and parasitic diseases: Secondary | ICD-10-CM | POA: Diagnosis not present

## 2014-12-10 DIAGNOSIS — G43909 Migraine, unspecified, not intractable, without status migrainosus: Secondary | ICD-10-CM | POA: Diagnosis not present

## 2014-12-10 DIAGNOSIS — Z79899 Other long term (current) drug therapy: Secondary | ICD-10-CM | POA: Insufficient documentation

## 2014-12-10 DIAGNOSIS — M545 Low back pain: Secondary | ICD-10-CM | POA: Diagnosis present

## 2014-12-10 HISTORY — DX: Migraine, unspecified, not intractable, without status migrainosus: G43.909

## 2014-12-10 MED ORDER — CYCLOBENZAPRINE HCL 10 MG PO TABS: 10.0000 mg | ORAL_TABLET | Freq: Two times a day (BID) | ORAL | Status: DC | PRN

## 2014-12-10 MED ORDER — HYDROCODONE-ACETAMINOPHEN 5-325 MG PO TABS
1.0000 | ORAL_TABLET | Freq: Once | ORAL | Status: AC
  Administered 2014-12-10: 1 via ORAL
  Filled 2014-12-10: qty 1

## 2014-12-10 MED ORDER — HYDROCODONE-ACETAMINOPHEN 5-325 MG PO TABS: 1.0000 | ORAL_TABLET | ORAL | Status: DC | PRN

## 2014-12-10 MED ORDER — PREDNISONE 10 MG (21) PO TBPK: 10.0000 mg | ORAL_TABLET | Freq: Every day | ORAL | Status: DC

## 2014-12-10 NOTE — Telephone Encounter (Signed)
Reviewed chart, RXs and EDV, MD had prescribed Steroid dose Pak but had failed to delete standard order of 10 mg po daily. In reviewing MD note which clearly states is supposed to be on dose pack. Clarified for Pharmacy.

## 2014-12-10 NOTE — ED Notes (Signed)
Pt bent over last Friday to pick something up and feels that she has pulled a muscle. Pt has been taking Flexeril that she has at home and Naproxen with no help. Pt says it is affecting her mobility in her legs. No abdominal pain, trouble urinating.

## 2014-12-10 NOTE — ED Notes (Signed)
Patient with no complaints at this time. Respirations even and unlabored. Skin warm/dry. Discharge instructions reviewed with patient at this time. Patient given opportunity to voice concerns/ask questions. Patient discharged at this time and left Emergency Department with steady gait.   

## 2014-12-10 NOTE — Discharge Instructions (Signed)
Please monitor for new or worsening signs or symptoms, return immediately if any present. Please follow-up with her primary care provider or orthopedist in one week if symptoms continue to persist. Please use medication only as directed, do not drink drive or operate any machinery while taking narcotic pain medication. Please reattach information.

## 2014-12-10 NOTE — ED Provider Notes (Signed)
History  This chart was scribed for non-physician practitioner, Eyvonne Mechanic, PA-C,working with Samuel Jester, DO, by Karle Plumber, ED Scribe. This patient was seen in room APFT24/APFT24 and the patient's care was started at 1:23 PM.  Chief Complaint  Patient presents with  . Back Pain   The history is provided by the patient and medical records. No language interpreter was used.    HPI Comments:  Lori Duncan is a 39 y.o. female who presents to the Emergency Department complaining of severe lower back pain that began two days ago. Pt states she bent over to get something out of her purse and upon standing she felt as if she pulled a muscle. She reports radiation of the pain down the posterior right leg down to her knees but only when she moves them. She states she strained her back about one year ago but this feels different. She has been taking Flexeril and Ibuprofen for the pain and applying ice packs with no significant relief. Any movement or position besides sitting makes the pain worse. Sitting helps alleviate the pain. She denies fever, chills, nausea, vomiting, numbness, tingling or weakness of the lower extremities, bowel or bladder incontinence. Denies h/o IV drug use or DM. Denies any trauma to the back.  Past Medical History  Diagnosis Date  . Chlamydia 10/2009  . Smoker   . LGSIL (low grade squamous intraepithelial dysplasia) 11/2010  . STD (sexually transmitted disease)     Chlamydia  . Migraines    Past Surgical History  Procedure Laterality Date  . Tonsillectomy and adenoidectomy    . Leep  2002  . Intrauterine device insertion  12/2009  . Colposcopy  11/2010  . Cervical biopsy  w/ loop electrode excision    . Shoulder surgery Right    Family History  Problem Relation Age of Onset  . Diabetes Mother   . Hypertension Mother   . Thyroid disease Mother   . Diabetes Father   . Diabetes Paternal Grandmother   . Hypertension Paternal Grandmother     History  Substance Use Topics  . Smoking status: Former Smoker -- 0.50 packs/day  . Smokeless tobacco: Never Used  . Alcohol Use: Yes     Comment: occassionally   OB History    Gravida Para Term Preterm AB TAB SAB Ectopic Multiple Living   1 1 1       1      Review of Systems  All other systems reviewed and are negative.  Allergies  Review of patient's allergies indicates no known allergies.  Home Medications   Prior to Admission medications   Medication Sig Start Date End Date Taking? Authorizing Provider  ibuprofen (ADVIL,MOTRIN) 200 MG tablet Take 400-600 mg by mouth every 6 (six) hours as needed. For pain   Yes Historical Provider, MD  naproxen (NAPROSYN) 500 MG tablet Take 500 mg by mouth 2 (two) times daily with a meal.   Yes Historical Provider, MD  SUMAtriptan (IMITREX) 50 MG tablet Take 50 mg by mouth every 2 (two) hours as needed. For migraine pain   Yes Historical Provider, MD  topiramate (TOPAMAX) 100 MG tablet Take 100 mg by mouth 2 (two) times daily.   Yes Historical Provider, MD  cyclobenzaprine (FLEXERIL) 10 MG tablet Take 1 tablet (10 mg total) by mouth 2 (two) times daily as needed for muscle spasms. 12/10/14   Eyvonne Mechanic, PA-C  HYDROcodone-acetaminophen (NORCO/VICODIN) 5-325 MG per tablet Take 1 tablet by mouth every 4 (four) hours  as needed. 12/10/14   Eyvonne Mechanic, PA-C  predniSONE (STERAPRED UNI-PAK 21 TAB) 10 MG (21) TBPK tablet Take 1 tablet (10 mg total) by mouth daily. Take 6 tabs by mouth daily  for 2 days, then 5 tabs for 2 days, then 4 tabs for 2 days, then 3 tabs for 2 days, 2 tabs for 2 days, then 1 tab by mouth daily for 2 days 12/10/14   Eyvonne Mechanic, PA-C   Triage Vitals: BP 120/75 mmHg  Pulse 86  Temp(Src) 98.1 F (36.7 C) (Oral)  Resp 18  Ht  (1.727 m)  Wt 175 lb (79.379 kg)  BMI 26.61 kg/m2  SpO2 100%  LMP 12/07/2014  Physical Exam  Constitutional: She is oriented to person, place, and time. She appears well-developed and  well-nourished.  HENT:  Head: Normocephalic and atraumatic.  Eyes: EOM are normal.  Neck: Normal range of motion.  Cardiovascular: Normal rate.   Pulmonary/Chest: Effort normal.  Musculoskeletal: Normal range of motion.  No C, T or L spine tenderness. Tenderness to bilateral lower lumbar region, more focused to the right. Straight leg raises of right leg positive. Negative SLR of left leg. No obvious signs of trauma. No abscess. Pain with hip flexion right and left.  Neurological: She is alert and oriented to person, place, and time.  Reflex Scores:      Patellar reflexes are 2+ on the right side and 2+ on the left side. Sensations grossly intact of bilateral extremities.  Skin: Skin is warm and dry.  Psychiatric: She has a normal mood and affect. Her behavior is normal.  Nursing note and vitals reviewed.   ED Course  Procedures (including critical care time) DIAGNOSTIC STUDIES: Oxygen Saturation is 100% on RA, normal by my interpretation.   COORDINATION OF CARE: 1:33 PM- Will prescribe muscle relaxer, pain medication and steroid dose pack. Will give first dose prior to discharge. Advised pt to continue taking Ibuprofen. Advised pt to follow up with PCP or orthopedist for continued or worsening symptoms. Pt verbalizes understanding and agrees to plan.  Medications  HYDROcodone-acetaminophen (NORCO/VICODIN) 5-325 MG per tablet 1 tablet (1 tablet Oral Given 12/10/14 1345)    Labs Review Labs Reviewed - No data to display  Imaging Review No results found.   EKG Interpretation None      MDM   Final diagnoses:  Bilateral low back pain with right-sided sciatica    Labs:   Imaging:   Consults:  Therapeutics:   Discharge Meds:   Assessment/Plan: Patient presents with back pain, likely muscular in nature. Patient has no red flags, no findings on exam that would necessitate emergent treatment or evaluation here in the ED. Neuro intact, patient able to ambulate with some  difficulty. Patient will be discharged home with symptomatic therapy, traction to follow-up with her primary care for orthopedic surgeon in 1 week if symptoms persist, follow-up sooner if symptoms worsen. Patient verbalizes her understanding and agreement to today's plan and no further questions concerns at the time of discharge.    I personally performed the services described in this documentation, which was scribed in my presence. The recorded information has been reviewed and is accurate.    Eyvonne Mechanic, PA-C 12/10/14 1534  Samuel Jester, DO 12/12/14 1651

## 2015-08-31 DIAGNOSIS — J3089 Other allergic rhinitis: Secondary | ICD-10-CM | POA: Diagnosis not present

## 2015-08-31 DIAGNOSIS — G43909 Migraine, unspecified, not intractable, without status migrainosus: Secondary | ICD-10-CM | POA: Diagnosis not present

## 2015-09-21 DIAGNOSIS — N760 Acute vaginitis: Secondary | ICD-10-CM | POA: Diagnosis not present

## 2015-12-06 DIAGNOSIS — E559 Vitamin D deficiency, unspecified: Secondary | ICD-10-CM | POA: Diagnosis not present

## 2015-12-06 DIAGNOSIS — G43909 Migraine, unspecified, not intractable, without status migrainosus: Secondary | ICD-10-CM | POA: Diagnosis not present

## 2015-12-06 DIAGNOSIS — J3089 Other allergic rhinitis: Secondary | ICD-10-CM | POA: Diagnosis not present

## 2016-01-16 DIAGNOSIS — J3089 Other allergic rhinitis: Secondary | ICD-10-CM | POA: Diagnosis not present

## 2016-01-25 DIAGNOSIS — W57XXXA Bitten or stung by nonvenomous insect and other nonvenomous arthropods, initial encounter: Secondary | ICD-10-CM | POA: Diagnosis not present

## 2016-01-25 DIAGNOSIS — R21 Rash and other nonspecific skin eruption: Secondary | ICD-10-CM | POA: Diagnosis not present

## 2016-03-13 DIAGNOSIS — Z6823 Body mass index (BMI) 23.0-23.9, adult: Secondary | ICD-10-CM | POA: Diagnosis not present

## 2016-03-13 DIAGNOSIS — J069 Acute upper respiratory infection, unspecified: Secondary | ICD-10-CM | POA: Diagnosis not present

## 2016-03-13 DIAGNOSIS — J209 Acute bronchitis, unspecified: Secondary | ICD-10-CM | POA: Diagnosis not present

## 2016-03-13 DIAGNOSIS — J019 Acute sinusitis, unspecified: Secondary | ICD-10-CM | POA: Diagnosis not present

## 2016-03-24 DIAGNOSIS — J3089 Other allergic rhinitis: Secondary | ICD-10-CM | POA: Diagnosis not present

## 2016-03-24 DIAGNOSIS — G43909 Migraine, unspecified, not intractable, without status migrainosus: Secondary | ICD-10-CM | POA: Diagnosis not present

## 2016-03-24 DIAGNOSIS — Z23 Encounter for immunization: Secondary | ICD-10-CM | POA: Diagnosis not present

## 2016-06-20 ENCOUNTER — Ambulatory Visit (HOSPITAL_COMMUNITY)
Admission: EM | Admit: 2016-06-20 | Discharge: 2016-06-20 | Disposition: A | Discharge status: Home / Self Care | Payer: BLUE CROSS/BLUE SHIELD | Attending: Family Medicine | Admitting: Family Medicine

## 2016-06-20 ENCOUNTER — Encounter (HOSPITAL_COMMUNITY): Payer: Self-pay

## 2016-06-20 DIAGNOSIS — N390 Urinary tract infection, site not specified: Secondary | ICD-10-CM | POA: Diagnosis not present

## 2016-06-20 LAB — POCT URINALYSIS DIP (DEVICE)
BILIRUBIN URINE: NEGATIVE
Glucose, UA: NEGATIVE mg/dL
KETONES UR: NEGATIVE mg/dL
Nitrite: NEGATIVE
Protein, ur: NEGATIVE mg/dL
Specific Gravity, Urine: 1.02 (ref 1.005–1.030)
Urobilinogen, UA: 0.2 mg/dL (ref 0.0–1.0)
pH: 7 (ref 5.0–8.0)

## 2016-06-20 MED ORDER — PHENAZOPYRIDINE HCL 200 MG PO TABS: 200.0000 mg | ORAL_TABLET | Freq: Three times a day (TID) | ORAL | 0 refills | Status: DC

## 2016-06-20 MED ORDER — NITROFURANTOIN MONOHYD MACRO 100 MG PO CAPS: 100.0000 mg | ORAL_CAPSULE | Freq: Two times a day (BID) | ORAL | 0 refills | Status: DC

## 2016-06-20 NOTE — ED Provider Notes (Signed)
CSN: [id number]     Arrival date & time 06/20/16  1343 History   None    Chief Complaint  Patient presents with  . Urinary Tract Infection   (Consider location/radiation/quality/duration/timing/severity/associated sxs/prior Treatment) Patient c/o dysuria and low grade fever this am.   The history is provided by the patient.  Urinary Tract Infection  Pain quality:  Burning Pain severity:  Mild Onset quality:  Sudden Duration:  1 day Timing:  Constant Progression:  Worsening Chronicity:  New Recent urinary tract infections: no   Worsened by:  Nothing Ineffective treatments:  None tried Urinary symptoms: discolored urine   Associated symptoms: abdominal pain     Past Medical History:  Diagnosis Date  . Chlamydia 10/2009  . LGSIL (low grade squamous intraepithelial dysplasia) 11/2010  . Migraines   . Smoker   . STD (sexually transmitted disease)    Chlamydia   Past Surgical History:  Procedure Laterality Date  . CERVICAL BIOPSY  W/ LOOP ELECTRODE EXCISION    . COLPOSCOPY  11/2010  . INTRAUTERINE DEVICE INSERTION  12/2009  . LEEP  2002  . SHOULDER SURGERY Right   . TONSILLECTOMY AND ADENOIDECTOMY     Family History  Problem Relation Age of Onset  . Diabetes Mother   . Hypertension Mother   . Thyroid disease Mother   . Diabetes Father   . Diabetes Paternal Grandmother   . Hypertension Paternal Grandmother    Social History  Substance Use Topics  . Smoking status: Former Smoker    Packs/day: 0.50  . Smokeless tobacco: Never Used  . Alcohol use Yes     Comment: occassionally   OB History    Gravida Para Term Preterm AB Living   1 1 1     1    SAB TAB Ectopic Multiple Live Births                 Review of Systems  Constitutional: Negative.   HENT: Negative.   Eyes: Negative.   Respiratory: Negative.   Gastrointestinal: Positive for abdominal pain.  Endocrine: Negative.   Genitourinary: Positive for dysuria.  Musculoskeletal: Negative.    Allergic/Immunologic: Negative.   Neurological: Negative.   Hematological: Negative.   Psychiatric/Behavioral: Negative.     Allergies  Patient has no known allergies.  Home Medications   Prior to Admission medications   Medication Sig Start Date End Date Taking? Authorizing Provider  cyclobenzaprine (FLEXERIL) 10 MG tablet Take 1 tablet (10 mg total) by mouth 2 (two) times daily as needed for muscle spasms. 12/10/14  Yes Jeffrey Hedges, PA-C  fexofenadine-pseudoephedrine (ALLEGRA-D) 60-120 MG 12 hr tablet Take 1 tablet by mouth 2 (two) times daily.   Yes Historical Provider, MD  fluticasone (FLONASE) 50 MCG/ACT nasal spray Place into both nostrils daily.   Yes Historical Provider, MD  ibuprofen (ADVIL,MOTRIN) 200 MG tablet Take 400-600 mg by mouth every 6 (six) hours as needed. For pain   Yes Historical Provider, MD  SUMAtriptan (IMITREX) 50 MG tablet Take 50 mg by mouth every 2 (two) hours as needed. For migraine pain   Yes Historical Provider, MD  topiramate (TOPAMAX) 100 MG tablet Take 100 mg by mouth 2 (two) times daily.   Yes Historical Provider, MD  HYDROcodone-acetaminophen (NORCO/VICODIN) 5-325 MG per tablet Take 1 tablet by mouth every 4 (four) hours as needed. 12/10/14   Eyvonne Mechanic, PA-C  naproxen (NAPROSYN) 500 MG tablet Take 500 mg by mouth 2 (two) times daily with a meal.  Historical Provider, MD  nitrofurantoin, macrocrystal-monohydrate, (MACROBID) 100 MG capsule Take 1 capsule (100 mg total) by mouth 2 (two) times daily. 06/20/16   Deatra CanterWilliam J Oxford, FNP  phenazopyridine (PYRIDIUM) 200 MG tablet Take 1 tablet (200 mg total) by mouth 3 (three) times daily. 06/20/16   Deatra CanterWilliam J Oxford, FNP  predniSONE (STERAPRED UNI-PAK 21 TAB) 10 MG (21) TBPK tablet Take 1 tablet (10 mg total) by mouth daily. Take 6 tabs by mouth daily  for 2 days, then 5 tabs for 2 days, then 4 tabs for 2 days, then 3 tabs for 2 days, 2 tabs for 2 days, then 1 tab by mouth daily for 2 days 12/10/14   Eyvonne MechanicJeffrey  Hedges, PA-C   Meds Ordered and Administered this Visit  Medications - No data to display  BP 116/55 (BP Location: Left Arm)   Pulse 72   Temp 98 F (36.7 C) (Oral)   Resp 20   LMP 06/06/2016 (Exact Date)   SpO2 100%  No data found.   Physical Exam  Constitutional: She appears well-developed and well-nourished.  HENT:  Head: Normocephalic and atraumatic.  Right Ear: External ear normal.  Left Ear: External ear normal.  Mouth/Throat: Oropharynx is clear and moist.  Eyes: Conjunctivae and EOM are normal. Pupils are equal, round, and reactive to light.  Neck: Normal range of motion. Neck supple.  Cardiovascular: Normal rate, regular rhythm and normal heart sounds.   Pulmonary/Chest: Effort normal and breath sounds normal.  Abdominal: Soft. Bowel sounds are normal.  Nursing note and vitals reviewed.   Urgent Care Course     Procedures (including critical care time)  Labs Review Labs Reviewed  POCT URINALYSIS DIP (DEVICE) - Abnormal; Notable for the following:       Result Value   Hgb urine dipstick SMALL (*)    Leukocytes, UA TRACE (*)    All other components within normal limits    Imaging Review No results found.   Visual Acuity Review  Right Eye Distance:   Left Eye Distance:   Bilateral Distance:    Right Eye Near:   Left Eye Near:    Bilateral Near:         MDM   1. Urinary tract infection without hematuria, site unspecified    Macrobid 100mg  one po bid x 7 days #14 Pyridium 200mg  one po tid x 2d #6  Push po fluids, rest, tylenol and motrin otc prn as directed for fever, arthralgias, and myalgias.  Follow up prn if sx's continue or persist.    Deatra CanterWilliam J Oxford, FNP 06/20/16 1517

## 2016-06-20 NOTE — ED Triage Notes (Signed)
Pt said she has been having foul odor, vaginal pain, dysuria, worse in the morning and has been going on for 2 days. Tried ibuprofen, percocet and cranberry juice. Running a low grade fever this morning 100.5.

## 2016-06-26 DIAGNOSIS — G43909 Migraine, unspecified, not intractable, without status migrainosus: Secondary | ICD-10-CM | POA: Diagnosis not present

## 2016-06-26 DIAGNOSIS — J3089 Other allergic rhinitis: Secondary | ICD-10-CM | POA: Diagnosis not present

## 2016-07-01 DIAGNOSIS — Z6825 Body mass index (BMI) 25.0-25.9, adult: Secondary | ICD-10-CM | POA: Diagnosis not present

## 2016-07-01 DIAGNOSIS — Z1389 Encounter for screening for other disorder: Secondary | ICD-10-CM | POA: Diagnosis not present

## 2016-07-01 DIAGNOSIS — J069 Acute upper respiratory infection, unspecified: Secondary | ICD-10-CM | POA: Diagnosis not present

## 2016-07-25 DIAGNOSIS — N760 Acute vaginitis: Secondary | ICD-10-CM | POA: Diagnosis not present

## 2016-09-13 ENCOUNTER — Emergency Department (HOSPITAL_COMMUNITY): Payer: BLUE CROSS/BLUE SHIELD

## 2016-09-13 ENCOUNTER — Emergency Department (HOSPITAL_COMMUNITY)
Admission: EM | Admit: 2016-09-13 | Discharge: 2016-09-13 | Disposition: A | Discharge status: Home / Self Care | Payer: BLUE CROSS/BLUE SHIELD | Attending: Emergency Medicine | Admitting: Emergency Medicine

## 2016-09-13 ENCOUNTER — Encounter (HOSPITAL_COMMUNITY): Payer: Self-pay | Admitting: *Deleted

## 2016-09-13 DIAGNOSIS — Z79899 Other long term (current) drug therapy: Secondary | ICD-10-CM | POA: Diagnosis not present

## 2016-09-13 DIAGNOSIS — S299XXA Unspecified injury of thorax, initial encounter: Secondary | ICD-10-CM | POA: Diagnosis not present

## 2016-09-13 DIAGNOSIS — Y939 Activity, unspecified: Secondary | ICD-10-CM | POA: Diagnosis not present

## 2016-09-13 DIAGNOSIS — Y999 Unspecified external cause status: Secondary | ICD-10-CM | POA: Diagnosis not present

## 2016-09-13 DIAGNOSIS — W228XXA Striking against or struck by other objects, initial encounter: Secondary | ICD-10-CM | POA: Insufficient documentation

## 2016-09-13 DIAGNOSIS — S20211A Contusion of right front wall of thorax, initial encounter: Secondary | ICD-10-CM | POA: Insufficient documentation

## 2016-09-13 DIAGNOSIS — R0781 Pleurodynia: Secondary | ICD-10-CM | POA: Diagnosis not present

## 2016-09-13 DIAGNOSIS — Z87891 Personal history of nicotine dependence: Secondary | ICD-10-CM | POA: Diagnosis not present

## 2016-09-13 DIAGNOSIS — Y929 Unspecified place or not applicable: Secondary | ICD-10-CM | POA: Diagnosis not present

## 2016-09-13 MED ORDER — HYDROCODONE-ACETAMINOPHEN 5-325 MG PO TABS
1.0000 | ORAL_TABLET | Freq: Once | ORAL | Status: AC
  Administered 2016-09-13: 1 via ORAL
  Filled 2016-09-13: qty 1

## 2016-09-13 MED ORDER — HYDROCODONE-ACETAMINOPHEN 5-325 MG PO TABS: ORAL_TABLET | ORAL | 0 refills | Status: DC

## 2016-09-13 NOTE — ED Notes (Signed)
Pt reports that she walked into a [facility] on Thursday and the pain is increasing unrelieved by tylenol and motrin.  Her physician is Dr Renette Butters and she has not started having mammograms yet
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
## 2016-09-13 NOTE — ED Notes (Signed)
From radiology 

## 2016-09-13 NOTE — Discharge Instructions (Signed)
Use a pillow or folded towel held to your side for coughing and deep breathing.  Use your incentive spirometer every 2 hrs.  Continue taking your ibuprofen 800 mg 3 times a day.  Follow-up with your doctor for recheck.  Return here for any worsening symptoms.

## 2016-09-15 DIAGNOSIS — E663 Overweight: Secondary | ICD-10-CM | POA: Diagnosis not present

## 2016-09-15 DIAGNOSIS — Y29XXXA Contact with blunt object, undetermined intent, initial encounter: Secondary | ICD-10-CM | POA: Diagnosis not present

## 2016-09-15 DIAGNOSIS — M791 Myalgia: Secondary | ICD-10-CM | POA: Diagnosis not present

## 2016-09-15 DIAGNOSIS — Z1389 Encounter for screening for other disorder: Secondary | ICD-10-CM | POA: Diagnosis not present

## 2016-09-15 DIAGNOSIS — T148XXA Other injury of unspecified body region, initial encounter: Secondary | ICD-10-CM | POA: Diagnosis not present

## 2016-09-15 DIAGNOSIS — Z6825 Body mass index (BMI) 25.0-25.9, adult: Secondary | ICD-10-CM | POA: Diagnosis not present

## 2016-09-15 DIAGNOSIS — G43109 Migraine with aura, not intractable, without status migrainosus: Secondary | ICD-10-CM | POA: Diagnosis not present

## 2016-09-15 MED FILL — Hydrocodone-Acetaminophen Tab 5-325 MG: ORAL | Qty: 6 | Status: AC

## 2016-09-18 NOTE — ED Provider Notes (Signed)
[facility] Provider Note   CSN: [id number] Arrival date & time: 09/13/16  2033     History   Chief Complaint Chief Complaint  Patient presents with  . Breast Pain    HPI Lori BridgemanJennifer M Risby is a 41 y.o. female.  HPI   Lori BridgemanJennifer M Duncan is a 41 y.o. female who presents to the Emergency Department complaining of pain to right right breast and chest wall.  She states that she was struck in the breast by a piece of plywood 2 days prior to arrival.  She describes a throbbing pain to the affected area that is worsened by movement, palpation and deep breathing.  Pain improves with rest.  She denies shortness of breath, abdominal pain, pain or numbness of the extremities or hx of cardiac disease or hx of DVT/PE.   Past Medical History:  Diagnosis Date  . Chlamydia 10/2009  . LGSIL (low grade squamous intraepithelial dysplasia) 11/2010  . Migraines   . Smoker   . STD (sexually transmitted disease)    Chlamydia    Patient Active Problem List   Diagnosis Date Noted  . Smoker   . Dysplasia of cervix     Past Surgical History:  Procedure Laterality Date  . CERVICAL BIOPSY  W/ LOOP ELECTRODE EXCISION    . COLPOSCOPY  11/2010  . INTRAUTERINE DEVICE INSERTION  12/2009  . LEEP  2002  . SHOULDER SURGERY Right   . TONSILLECTOMY AND ADENOIDECTOMY      OB History    Gravida Para Term Preterm AB Living   1 1 1     1    SAB TAB Ectopic Multiple Live Births                   Home Medications    Prior to Admission medications   Medication Sig Start Date End Date Taking? Authorizing Provider  cyclobenzaprine (FLEXERIL) 10 MG tablet Take 1 tablet (10 mg total) by mouth 2 (two) times daily as needed for muscle spasms. 12/10/14   Tinnie GensJeffrey Hedges, PA-C  fexofenadine-pseudoephedrine (ALLEGRA-D) 60-120 MG 12 hr tablet Take 1 tablet by mouth 2 (two) times daily.    Historical Provider, MD  fluticasone (FLONASE) 50 MCG/ACT nasal spray Place into both nostrils daily.    Historical  Provider, MD  HYDROcodone-acetaminophen (NORCO/VICODIN) 5-325 MG tablet Take one-two tabs po q 4-6 hrs prn pain 09/13/16   Jalin Alicea, PA-C  HYDROcodone-acetaminophen (NORCO/VICODIN) 5-325 MG tablet Take one-two tabs po q 4-6 hrs prn pain 09/13/16   Sabastien Tyler, PA-C  ibuprofen (ADVIL,MOTRIN) 200 MG tablet Take 400-600 mg by mouth every 6 (six) hours as needed. For pain    Historical Provider, MD  naproxen (NAPROSYN) 500 MG tablet Take 500 mg by mouth 2 (two) times daily with a meal.    Historical Provider, MD  nitrofurantoin, macrocrystal-monohydrate, (MACROBID) 100 MG capsule Take 1 capsule (100 mg total) by mouth 2 (two) times daily. 06/20/16   Deatra CanterWilliam J Oxford, FNP  phenazopyridine (PYRIDIUM) 200 MG tablet Take 1 tablet (200 mg total) by mouth 3 (three) times daily. 06/20/16   Deatra CanterWilliam J Oxford, FNP  predniSONE (STERAPRED UNI-PAK 21 TAB) 10 MG (21) TBPK tablet Take 1 tablet (10 mg total) by mouth daily. Take 6 tabs by mouth daily  for 2 days, then 5 tabs for 2 days, then 4 tabs for 2 days, then 3 tabs for 2 days, 2 tabs for 2 days, then 1 tab by mouth daily for 2 days 12/10/14  Eyvonne Mechanic, PA-C  SUMAtriptan (IMITREX) 50 MG tablet Take 50 mg by mouth every 2 (two) hours as needed. For migraine pain    Historical Provider, MD  topiramate (TOPAMAX) 100 MG tablet Take 100 mg by mouth 2 (two) times daily.    Historical Provider, MD    Family History Family History  Problem Relation Age of Onset  . Diabetes Mother   . Hypertension Mother   . Thyroid disease Mother   . Diabetes Father   . Diabetes Paternal Grandmother   . Hypertension Paternal Grandmother     Social History Social History  Substance Use Topics  . Smoking status: Former Smoker    Packs/day: 0.50  . Smokeless tobacco: Never Used  . Alcohol use Yes     Comment: occassionally     Allergies   Patient has no known allergies.   Review of Systems Review of Systems  Constitutional: Negative for chills and fever.    Respiratory: Negative for cough, chest tightness and shortness of breath.   Cardiovascular: Positive for chest pain (right chest and breast pain).  Gastrointestinal: Negative for abdominal pain, nausea and vomiting.  Genitourinary: Negative for difficulty urinating, dysuria and hematuria.  Musculoskeletal: Negative for arthralgias, joint swelling and neck pain.  Skin: Negative for color change and wound.  Neurological: Negative for weakness and numbness.  All other systems reviewed and are negative.    Physical Exam Updated Vital Signs BP (!) 121/51 (BP Location: Left Arm)   Pulse 84   Temp 98.9 F (37.2 C) (Oral)   Resp 17   Ht 5\' 8"  (1.727 m)   Wt 81.6 kg   LMP 09/06/2016   SpO2 99%   BMI 27.37 kg/m   Physical Exam  Constitutional: She is oriented to person, place, and time. She appears well-developed and well-nourished. No distress.  HENT:  Head: Atraumatic.  Mouth/Throat: Oropharynx is clear and moist.  Neck: Normal range of motion. Neck supple.  Cardiovascular: Normal rate, regular rhythm, normal heart sounds and intact distal pulses.   No murmur heard. Pulmonary/Chest: Effort normal and breath sounds normal. No respiratory distress. She exhibits tenderness (focal ttp of the right upper chest wall.  small bruising present.  no bony deformity or crepitus. ).  Abdominal: Soft. She exhibits no distension and no mass. There is no tenderness. There is no guarding.  Musculoskeletal: Normal range of motion.  Neurological: She is alert and oriented to person, place, and time.  Skin: Skin is warm and dry. Capillary refill takes less than 2 seconds.  Psychiatric: She has a normal mood and affect.  Nursing note and vitals reviewed.    ED Treatments / Results  Labs (all labs ordered are listed, but only abnormal results are displayed) Labs Reviewed - No data to display  EKG  EKG Interpretation None       Radiology Dg Ribs Unilateral W/chest Right  Result Date:  09/13/2016 CLINICAL DATA:  Pain after trauma EXAM: RIGHT RIBS AND CHEST - 3+ VIEW COMPARISON:  June 05, 2014 FINDINGS: No pneumothorax. Thinning of the distal right clavicle is new since 2016 but has a chronic appearance, likely from previous trauma. The heart, hila, and mediastinum are normal. No pulmonary nodules or masses. No rib fractures are identified. IMPRESSION: No acute abnormalities. Electronically Signed   By: Gerome Sam III M.D   On: 09/13/2016 21:25    Procedures Procedures (including critical care time)  Medications Ordered in ED Medications  HYDROcodone-acetaminophen (NORCO/VICODIN) 5-325 MG per tablet 1  tablet (1 tablet Oral Given 09/13/16 2205)     Initial Impression / Assessment and Plan / ED Course  I have reviewed the triage vital signs and the nursing notes.  Pertinent labs & imaging results that were available during my care of the patient were reviewed by me and considered in my medical decision making (see chart for details).     CXR neg for fx, but discussed possible occult rib fx.  vitals stable.  Pt ambulates with steady gait.  Pain reproduced with palpation.  Doubt PE  Pt agrees to use incentive spirometer at home.  PCP f/u or ER return if not improving .  Pt appears stable for d/c  Final Clinical Impressions(s) / ED Diagnoses   Final diagnoses:  Rib contusion, right, initial encounter    New Prescriptions Discharge Medication List as of 09/13/2016 10:02 PM       Shery Wauneka Trisha Mangle, PA-C 09/18/16 1615    Raeford Razor, MD 09/20/16 [phone]

## 2016-09-25 DIAGNOSIS — J3089 Other allergic rhinitis: Secondary | ICD-10-CM | POA: Diagnosis not present

## 2016-09-25 DIAGNOSIS — G43909 Migraine, unspecified, not intractable, without status migrainosus: Secondary | ICD-10-CM | POA: Diagnosis not present

## 2016-12-22 DIAGNOSIS — M7542 Impingement syndrome of left shoulder: Secondary | ICD-10-CM | POA: Diagnosis not present

## 2016-12-22 DIAGNOSIS — J3089 Other allergic rhinitis: Secondary | ICD-10-CM | POA: Diagnosis not present

## 2016-12-22 DIAGNOSIS — G43909 Migraine, unspecified, not intractable, without status migrainosus: Secondary | ICD-10-CM | POA: Diagnosis not present

## 2016-12-29 DIAGNOSIS — M25512 Pain in left shoulder: Secondary | ICD-10-CM | POA: Diagnosis not present

## 2017-01-21 DIAGNOSIS — Z6828 Body mass index (BMI) 28.0-28.9, adult: Secondary | ICD-10-CM | POA: Diagnosis not present

## 2017-01-21 DIAGNOSIS — Z113 Encounter for screening for infections with a predominantly sexual mode of transmission: Secondary | ICD-10-CM | POA: Diagnosis not present

## 2017-01-21 DIAGNOSIS — Z1231 Encounter for screening mammogram for malignant neoplasm of breast: Secondary | ICD-10-CM | POA: Diagnosis not present

## 2017-01-21 DIAGNOSIS — Z1159 Encounter for screening for other viral diseases: Secondary | ICD-10-CM | POA: Diagnosis not present

## 2017-01-21 DIAGNOSIS — Z01419 Encounter for gynecological examination (general) (routine) without abnormal findings: Secondary | ICD-10-CM | POA: Diagnosis not present

## 2017-01-21 DIAGNOSIS — Z114 Encounter for screening for human immunodeficiency virus [HIV]: Secondary | ICD-10-CM | POA: Diagnosis not present

## 2017-01-29 DIAGNOSIS — N6312 Unspecified lump in the right breast, upper inner quadrant: Secondary | ICD-10-CM | POA: Diagnosis not present

## 2017-02-04 ENCOUNTER — Other Ambulatory Visit: Payer: Self-pay | Admitting: Radiology

## 2017-02-04 DIAGNOSIS — N760 Acute vaginitis: Secondary | ICD-10-CM | POA: Diagnosis not present

## 2017-02-04 DIAGNOSIS — D241 Benign neoplasm of right breast: Secondary | ICD-10-CM | POA: Diagnosis not present

## 2017-02-04 DIAGNOSIS — Z0000 Encounter for general adult medical examination without abnormal findings: Secondary | ICD-10-CM | POA: Diagnosis not present

## 2017-03-27 DIAGNOSIS — G43909 Migraine, unspecified, not intractable, without status migrainosus: Secondary | ICD-10-CM | POA: Diagnosis not present

## 2017-03-27 DIAGNOSIS — J3089 Other allergic rhinitis: Secondary | ICD-10-CM | POA: Diagnosis not present

## 2017-03-27 DIAGNOSIS — Z23 Encounter for immunization: Secondary | ICD-10-CM | POA: Diagnosis not present

## 2017-05-04 DIAGNOSIS — N760 Acute vaginitis: Secondary | ICD-10-CM | POA: Diagnosis not present

## 2017-05-22 DIAGNOSIS — R109 Unspecified abdominal pain: Secondary | ICD-10-CM | POA: Diagnosis not present

## 2017-05-28 DIAGNOSIS — R109 Unspecified abdominal pain: Secondary | ICD-10-CM | POA: Diagnosis not present

## 2017-05-28 DIAGNOSIS — Z118 Encounter for screening for other infectious and parasitic diseases: Secondary | ICD-10-CM | POA: Diagnosis not present

## 2017-05-28 DIAGNOSIS — R102 Pelvic and perineal pain: Secondary | ICD-10-CM | POA: Diagnosis not present

## 2017-05-28 DIAGNOSIS — Z0000 Encounter for general adult medical examination without abnormal findings: Secondary | ICD-10-CM | POA: Diagnosis not present

## 2017-06-25 DIAGNOSIS — Z719 Counseling, unspecified: Secondary | ICD-10-CM | POA: Diagnosis not present

## 2017-06-25 DIAGNOSIS — Z1389 Encounter for screening for other disorder: Secondary | ICD-10-CM | POA: Diagnosis not present

## 2017-06-25 DIAGNOSIS — J069 Acute upper respiratory infection, unspecified: Secondary | ICD-10-CM | POA: Diagnosis not present

## 2017-06-25 DIAGNOSIS — E663 Overweight: Secondary | ICD-10-CM | POA: Diagnosis not present

## 2017-06-25 DIAGNOSIS — Z6828 Body mass index (BMI) 28.0-28.9, adult: Secondary | ICD-10-CM | POA: Diagnosis not present

## 2017-07-09 DIAGNOSIS — J302 Other seasonal allergic rhinitis: Secondary | ICD-10-CM | POA: Diagnosis not present

## 2017-07-09 DIAGNOSIS — J069 Acute upper respiratory infection, unspecified: Secondary | ICD-10-CM | POA: Diagnosis not present

## 2017-07-09 DIAGNOSIS — E663 Overweight: Secondary | ICD-10-CM | POA: Diagnosis not present

## 2017-07-09 DIAGNOSIS — Z6828 Body mass index (BMI) 28.0-28.9, adult: Secondary | ICD-10-CM | POA: Diagnosis not present

## 2017-07-13 DIAGNOSIS — J3089 Other allergic rhinitis: Secondary | ICD-10-CM | POA: Diagnosis not present

## 2017-07-13 DIAGNOSIS — G43909 Migraine, unspecified, not intractable, without status migrainosus: Secondary | ICD-10-CM | POA: Diagnosis not present

## 2017-07-22 DIAGNOSIS — J069 Acute upper respiratory infection, unspecified: Secondary | ICD-10-CM | POA: Diagnosis not present

## 2017-07-22 DIAGNOSIS — F4321 Adjustment disorder with depressed mood: Secondary | ICD-10-CM | POA: Diagnosis not present

## 2017-07-22 DIAGNOSIS — F418 Other specified anxiety disorders: Secondary | ICD-10-CM | POA: Diagnosis not present

## 2017-08-13 DIAGNOSIS — N6312 Unspecified lump in the right breast, upper inner quadrant: Secondary | ICD-10-CM | POA: Diagnosis not present

## 2017-09-09 DIAGNOSIS — F4321 Adjustment disorder with depressed mood: Secondary | ICD-10-CM | POA: Diagnosis not present

## 2017-09-09 DIAGNOSIS — F418 Other specified anxiety disorders: Secondary | ICD-10-CM | POA: Diagnosis not present

## 2017-09-09 DIAGNOSIS — G4700 Insomnia, unspecified: Secondary | ICD-10-CM | POA: Diagnosis not present

## 2017-09-30 ENCOUNTER — Encounter (HOSPITAL_COMMUNITY): Payer: Self-pay | Admitting: Emergency Medicine

## 2017-09-30 ENCOUNTER — Other Ambulatory Visit: Payer: Self-pay

## 2017-09-30 ENCOUNTER — Ambulatory Visit (HOSPITAL_COMMUNITY)
Admission: EM | Admit: 2017-09-30 | Discharge: 2017-09-30 | Disposition: A | Discharge status: Home / Self Care | Payer: BLUE CROSS/BLUE SHIELD | Attending: Family Medicine | Admitting: Family Medicine

## 2017-09-30 DIAGNOSIS — M778 Other enthesopathies, not elsewhere classified: Secondary | ICD-10-CM

## 2017-09-30 DIAGNOSIS — M25531 Pain in right wrist: Secondary | ICD-10-CM | POA: Diagnosis not present

## 2017-09-30 NOTE — ED Triage Notes (Signed)
Pt. Stated, Im having rt. Wrist pain, no injury. I need to have an xray.

## 2017-10-01 NOTE — ED Provider Notes (Addendum)
[facility]   [id number] 09/30/17 Arrival Time: 1641  ASSESSMENT & PLAN:  1. Wrist tendonitis   L extensor carpi ulnaris  Placed in wrist splint. I see no need for imaging today. Discussed. OTC NSAID for the next several days. Recommend f/u if not seeing improvement over the next week.  Reviewed expectations re: course of current medical issues. Questions answered. Outlined signs and symptoms indicating need for more acute intervention. Patient verbalized understanding. After Visit Summary given.  SUBJECTIVE: History from: patient. Lori Duncan is a 42 y.o. female who reports persistent mild to moderate pain of her left wrist that is stable since beginning; described as a transient sharp and intense pain with certain movements and without radiation. Pain located over ulnar side of wrist. Onset: started to notice this a few days ago; gradual worsening Injury/trama: no. Types a lot at work. Relieved by: holding still. Worsened by: certain movements. Associated symptoms: none reported. Extremity sensation changes or weakness: none. Self treatment: Mobic today without much help. History of similar: no  ROS: As per HPI.   OBJECTIVE:  Vitals:   09/30/17 1820 09/30/17 1821  BP: 123/66   Pulse: 73   Resp: 16   Temp: 98.6 F (37 C)   TempSrc: Oral   SpO2: 100%   Weight:  195 lb (88.5 kg)  Height:   (1.727 m)    General appearance: alert; no distress Extremities: warm and well perfused; symmetrical with no gross deformities; localized point tenderness over her left ulnar wrist with no swelling and no bruising; ROM: normal but painful with certain motions; no bony tenderness CV: normal extremity capillary refill Skin: warm and dry Neurologic: normal gait; normal symmetric reflexes in all extremities; normal sensation in all extremities Psychological: alert and cooperative; normal mood and affect  No Known Allergies  Past Medical History:  Diagnosis  Date  . Chlamydia 10/2009  . LGSIL (low grade squamous intraepithelial dysplasia) 11/2010  . Migraines   . Smoker   . STD (sexually transmitted disease)    Chlamydia   Social History   Socioeconomic History  . Marital status: Married    Spouse name: Not on file  . Number of children: Not on file  . Years of education: Not on file  . Highest education level: Not on file  Occupational History  . Not on file  Social Needs  . Financial resource strain: Not on file  . Food insecurity:    Worry: Not on file    Inability: Not on file  . Transportation needs:    Medical: Not on file    Non-medical: Not on file  Tobacco Use  . Smoking status: Former Smoker    Packs/day: 0.50  . Smokeless tobacco: Never Used  Substance and Sexual Activity  . Alcohol use: Yes    Comment: occassionally  . Drug use: No    Types: Codeine  . Sexual activity: Yes    Birth control/protection: IUD    Comment: MIRENA 12-2009  Lifestyle  . Physical activity:    Days per week: Not on file    Minutes per session: Not on file  . Stress: Not on file  Relationships  . Social connections:    Talks on phone: Not on file    Gets together: Not on file    Attends religious service: Not on file    Active member of club or organization: Not on file    Attends meetings of clubs or organizations: Not on file  Relationship status: Not on file  . Intimate partner violence:    Fear of current or ex partner: Not on file    Emotionally abused: Not on file    Physically abused: Not on file    Forced sexual activity: Not on file  Other Topics Concern  . Not on file  Social History Narrative  . Not on file   Family History  Problem Relation Age of Onset  . Diabetes Mother   . Hypertension Mother   . Thyroid disease Mother   . Diabetes Father   . Diabetes Paternal Grandmother   . Hypertension Paternal Grandmother    Past Surgical History:  Procedure Laterality Date  . CERVICAL BIOPSY  W/ LOOP ELECTRODE  EXCISION    . COLPOSCOPY  11/2010  . INTRAUTERINE DEVICE INSERTION  12/2009  . LEEP  2002  . SHOULDER SURGERY Right   . TONSILLECTOMY AND ADENOIDECTOMY        Mardella Layman, MD 10/01/17 1010    Mardella Layman, MD 10/01/17 1010

## 2017-10-29 DIAGNOSIS — S63591A Other specified sprain of right wrist, initial encounter: Secondary | ICD-10-CM | POA: Diagnosis not present

## 2017-11-07 DIAGNOSIS — M25531 Pain in right wrist: Secondary | ICD-10-CM | POA: Diagnosis not present

## 2017-11-24 DIAGNOSIS — S63521D Sprain of radiocarpal joint of right wrist, subsequent encounter: Secondary | ICD-10-CM | POA: Diagnosis not present

## 2017-11-25 DIAGNOSIS — Z0000 Encounter for general adult medical examination without abnormal findings: Secondary | ICD-10-CM | POA: Diagnosis not present

## 2017-11-25 DIAGNOSIS — Z5181 Encounter for therapeutic drug level monitoring: Secondary | ICD-10-CM | POA: Diagnosis not present

## 2017-11-25 DIAGNOSIS — E559 Vitamin D deficiency, unspecified: Secondary | ICD-10-CM | POA: Diagnosis not present

## 2017-11-25 DIAGNOSIS — N926 Irregular menstruation, unspecified: Secondary | ICD-10-CM | POA: Diagnosis not present

## 2017-11-26 DIAGNOSIS — Z5181 Encounter for therapeutic drug level monitoring: Secondary | ICD-10-CM | POA: Diagnosis not present

## 2017-11-26 DIAGNOSIS — Z0000 Encounter for general adult medical examination without abnormal findings: Secondary | ICD-10-CM | POA: Diagnosis not present

## 2017-11-26 DIAGNOSIS — E559 Vitamin D deficiency, unspecified: Secondary | ICD-10-CM | POA: Diagnosis not present

## 2017-11-26 DIAGNOSIS — N926 Irregular menstruation, unspecified: Secondary | ICD-10-CM | POA: Diagnosis not present

## 2017-11-30 DIAGNOSIS — Z1389 Encounter for screening for other disorder: Secondary | ICD-10-CM | POA: Diagnosis not present

## 2017-11-30 DIAGNOSIS — E663 Overweight: Secondary | ICD-10-CM | POA: Diagnosis not present

## 2017-11-30 DIAGNOSIS — G43109 Migraine with aura, not intractable, without status migrainosus: Secondary | ICD-10-CM | POA: Diagnosis not present

## 2017-11-30 DIAGNOSIS — Z6829 Body mass index (BMI) 29.0-29.9, adult: Secondary | ICD-10-CM | POA: Diagnosis not present

## 2018-01-28 DIAGNOSIS — G43909 Migraine, unspecified, not intractable, without status migrainosus: Secondary | ICD-10-CM | POA: Diagnosis not present

## 2018-01-28 DIAGNOSIS — E663 Overweight: Secondary | ICD-10-CM | POA: Diagnosis not present

## 2018-02-10 DIAGNOSIS — Z6829 Body mass index (BMI) 29.0-29.9, adult: Secondary | ICD-10-CM | POA: Diagnosis not present

## 2018-02-10 DIAGNOSIS — G43909 Migraine, unspecified, not intractable, without status migrainosus: Secondary | ICD-10-CM | POA: Diagnosis not present

## 2018-02-10 DIAGNOSIS — G43109 Migraine with aura, not intractable, without status migrainosus: Secondary | ICD-10-CM | POA: Diagnosis not present

## 2018-03-24 DIAGNOSIS — Z1231 Encounter for screening mammogram for malignant neoplasm of breast: Secondary | ICD-10-CM | POA: Diagnosis not present

## 2018-03-24 DIAGNOSIS — Z01419 Encounter for gynecological examination (general) (routine) without abnormal findings: Secondary | ICD-10-CM | POA: Diagnosis not present

## 2018-03-24 DIAGNOSIS — Z6831 Body mass index (BMI) 31.0-31.9, adult: Secondary | ICD-10-CM | POA: Diagnosis not present

## 2018-04-06 DIAGNOSIS — Z30430 Encounter for insertion of intrauterine contraceptive device: Secondary | ICD-10-CM | POA: Diagnosis not present

## 2018-04-27 DIAGNOSIS — H6993 Unspecified Eustachian tube disorder, bilateral: Secondary | ICD-10-CM | POA: Diagnosis not present

## 2018-04-27 DIAGNOSIS — J019 Acute sinusitis, unspecified: Secondary | ICD-10-CM | POA: Diagnosis not present

## 2018-04-27 DIAGNOSIS — E6609 Other obesity due to excess calories: Secondary | ICD-10-CM | POA: Diagnosis not present

## 2018-05-05 DIAGNOSIS — J069 Acute upper respiratory infection, unspecified: Secondary | ICD-10-CM | POA: Diagnosis not present

## 2018-05-05 DIAGNOSIS — Z6830 Body mass index (BMI) 30.0-30.9, adult: Secondary | ICD-10-CM | POA: Diagnosis not present

## 2018-05-05 DIAGNOSIS — A493 Mycoplasma infection, unspecified site: Secondary | ICD-10-CM | POA: Diagnosis not present

## 2018-05-05 DIAGNOSIS — E6609 Other obesity due to excess calories: Secondary | ICD-10-CM | POA: Diagnosis not present

## 2018-05-16 ENCOUNTER — Other Ambulatory Visit: Payer: Self-pay

## 2018-05-16 ENCOUNTER — Encounter (HOSPITAL_COMMUNITY): Payer: Self-pay | Admitting: Emergency Medicine

## 2018-05-16 ENCOUNTER — Ambulatory Visit (INDEPENDENT_AMBULATORY_CARE_PROVIDER_SITE_OTHER): Payer: 59

## 2018-05-16 ENCOUNTER — Ambulatory Visit (HOSPITAL_COMMUNITY)
Admission: EM | Admit: 2018-05-16 | Discharge: 2018-05-16 | Disposition: A | Discharge status: Home / Self Care | Payer: 59 | Attending: Family Medicine | Admitting: Family Medicine

## 2018-05-16 DIAGNOSIS — R05 Cough: Secondary | ICD-10-CM

## 2018-05-16 DIAGNOSIS — Z87891 Personal history of nicotine dependence: Secondary | ICD-10-CM

## 2018-05-16 DIAGNOSIS — J209 Acute bronchitis, unspecified: Secondary | ICD-10-CM | POA: Diagnosis not present

## 2018-05-16 MED ORDER — HYDROCODONE-HOMATROPINE 5-1.5 MG/5ML PO SYRP: 5.0000 mL | ORAL_SOLUTION | Freq: Four times a day (QID) | ORAL | 0 refills | Status: DC | PRN

## 2018-05-16 MED ORDER — PSEUDOEPH-BROMPHEN-DM 30-2-10 MG/5ML PO SYRP: 5.0000 mL | ORAL_SOLUTION | Freq: Four times a day (QID) | ORAL | 0 refills | Status: DC | PRN

## 2018-05-16 NOTE — Discharge Instructions (Signed)
X-ray did not show pneumonia X-ray suggestive of chronic bronchitis, likely from previous smoking and this is flared up Bronchitis cough can linger for weeks You may try brompheniramine cough syrup provided as needed during the day Hycodan cough syrup when you will be at home or bedtime-this will cause drowsiness, do not drive or work after taking

## 2018-05-16 NOTE — ED Triage Notes (Signed)
The patient presented to the [facility] with a complaint of a cough and fever. The patient reported that she was diagnosed with "walking pneumonia" [facility] [place].

## 2018-05-17 NOTE — ED Provider Notes (Signed)
[facility]    CSN: [id number] Arrival date & time: 05/16/18  1501     History   Chief Complaint Chief Complaint  Patient presents with  . Cough    HPI Lori Duncan is a 42 y.o. female previous smoker presenting today for evaluation of a cough.  Patient states that she has had a cough for approximately 1 month.  She has had associated chest tightness.  She notices a sore throat in the morning and has had associated nasal congestion.  She initially was seen on 12/8 and treated for sinusitis with Augmentin and a steroid shot.  On 12/20 she was treated for walking pneumonia with azithromycin and prednisone.  Symptoms were beginning to improve but yesterday began to have fever and cough again.  She has had difficulty sleeping due to persistent cough.  Cough is been productive.  Denies any fever today.  Last menstrual period was 12/4, on Mirena.  HPI  Past Medical History:  Diagnosis Date  . Chlamydia 10/2009  . LGSIL (low grade squamous intraepithelial dysplasia) 11/2010  . Migraines   . Smoker   . STD (sexually transmitted disease)    Chlamydia    Patient Active Problem List   Diagnosis Date Noted  . Smoker   . Dysplasia of cervix     Past Surgical History:  Procedure Laterality Date  . CERVICAL BIOPSY  W/ LOOP ELECTRODE EXCISION    . COLPOSCOPY  11/2010  . INTRAUTERINE DEVICE INSERTION  12/2009  . LEEP  2002  . SHOULDER SURGERY Right   . TONSILLECTOMY AND ADENOIDECTOMY      OB History    Gravida  1   Para  1   Term  1   Preterm      AB      Living  1     SAB      TAB      Ectopic      Multiple      Live Births               Home Medications    Prior to Admission medications   Medication Sig Start Date End Date Taking? Authorizing Provider  ibuprofen (ADVIL,MOTRIN) 200 MG tablet Take 400-600 mg by mouth every 6 (six) hours as needed. For pain   Yes [provider]  predniSONE (STERAPRED UNI-PAK 21 TAB) 10 MG (21)  TBPK tablet Take 1 tablet (10 mg total) by mouth daily. Take 6 tabs by mouth daily  for 2 days, then 5 tabs for 2 days, then 4 tabs for 2 days, then 3 tabs for 2 days, 2 tabs for 2 days, then 1 tab by mouth daily for 2 days 12/10/14  Yes Hedges, Tinnie Gens, PA-C  topiramate (TOPAMAX) 100 MG tablet Take 100 mg by mouth 2 (two) times daily.   Yes [provider]  brompheniramine-pseudoephedrine-DM 30-2-10 MG/5ML syrup Take 5 mLs by mouth 4 (four) times daily as needed. Use during day 05/16/18   Wieters, Hallie C, PA-C  HYDROcodone-homatropine (HYCODAN) 5-1.5 MG/5ML syrup Take 5 mLs by mouth every 6 (six) hours as needed for cough. Will cause drowsiness, do not drive or work after taking. 05/16/18   Wieters, Junius Creamer, PA-C    Family History Family History  Problem Relation Age of Onset  . Diabetes Mother   . Hypertension Mother   . Thyroid disease Mother   . Diabetes Father   . Diabetes Paternal Grandmother   . Hypertension Paternal Grandmother  Social History Social History   Tobacco Use  . Smoking status: Former Smoker    Packs/day: 0.50  . Smokeless tobacco: Never Used  Substance Use Topics  . Alcohol use: Yes    Comment: occassionally  . Drug use: No    Types: Codeine     Allergies   Patient has no known allergies.   Review of Systems Review of Systems  Constitutional: Positive for fatigue and fever. Negative for activity change, appetite change and chills.  HENT: Positive for congestion, rhinorrhea and sore throat. Negative for ear pain, sinus pressure and trouble swallowing.   Eyes: Negative for discharge and redness.  Respiratory: Positive for cough and shortness of breath. Negative for chest tightness.   Cardiovascular: Negative for chest pain.  Gastrointestinal: Negative for abdominal pain, diarrhea, nausea and vomiting.  Musculoskeletal: Negative for myalgias.  Skin: Negative for rash.  Neurological: Negative for dizziness, light-headedness and headaches.       Physical Exam Triage Vital Signs ED Triage Vitals  Enc Vitals Group     BP 05/16/18 1624 124/73     Pulse Rate 05/16/18 1624 81     Resp 05/16/18 1624 18     Temp 05/16/18 1624 98 F (36.7 C)     Temp Source 05/16/18 1624 Oral     SpO2 05/16/18 1624 97 %     Weight --      Height --      Head Circumference --      Peak Flow --      Pain Score 05/16/18 1621 6     Pain Loc --      Pain Edu? --      Excl. in GC? --    No data found.  Updated Vital Signs BP 124/73 (BP Location: Right Arm)   Pulse 81   Temp 98 F (36.7 C) (Oral)   Resp 18   LMP 04/21/2018   SpO2 97%   Visual Acuity Right Eye Distance:   Left Eye Distance:   Bilateral Distance:    Right Eye Near:   Left Eye Near:    Bilateral Near:     Physical Exam Vitals signs and nursing note reviewed.  Constitutional:      General: She is not in acute distress.    Appearance: She is well-developed.  HENT:     Head: Normocephalic and atraumatic.     Ears:     Comments: Bilateral ears without tenderness to palpation of external auricle, tragus and mastoid, EAC's without erythema or swelling, TM's with good bony landmarks and cone of light. Non erythematous.    Nose:     Comments: Nasal mucosa mildly erythematous    Mouth/Throat:     Comments: Oral mucosa pink and moist, no tonsillar enlargement or exudate. Posterior pharynx patent and nonerythematous, no uvula deviation or swelling. Normal phonation. Eyes:     Conjunctiva/sclera: Conjunctivae normal.  Neck:     Musculoskeletal: Neck supple.  Cardiovascular:     Rate and Rhythm: Normal rate and regular rhythm.     Heart sounds: No murmur.  Pulmonary:     Effort: Pulmonary effort is normal. No respiratory distress.     Comments: Frequent coughing in room, sounds dry, frequent bronchospasm with deep inspiration, no obvious wheezing or rales auscultated. Abdominal:     Palpations: Abdomen is soft.     Tenderness: There is no abdominal tenderness.   Skin:    General: Skin is warm and dry.  Neurological:  Mental Status: She is alert.      UC Treatments / Results  Labs (all labs ordered are listed, but only abnormal results are displayed) Labs Reviewed - No data to display  EKG None  Radiology Dg Chest 2 View  Result Date: 05/16/2018 CLINICAL DATA:  One month history of cough which was not relieved by 2 separate courses of antibiotic therapy. Former smoker. EXAM: CHEST - 2 VIEW COMPARISON:  09/13/2016, 06/05/2014. FINDINGS: Cardiomediastinal silhouette unremarkable and unchanged. Mildly prominent bronchovascular markings diffusely and mild central peribronchial thickening, unchanged. Lungs otherwise clear. No localized airspace consolidation. No pleural effusions. No pneumothorax. Normal pulmonary vascularity. Visualized bony thorax intact. IMPRESSION: Stable mild changes of chronic bronchitis and/or asthma. No acute cardiopulmonary disease. Electronically Signed   By: Hulan Saashomas  Lawrence M.D.   On: 05/16/2018 17:27    Procedures Procedures (including critical care time)  Medications Ordered in UC Medications - No data to display  Initial Impression / Assessment and Plan / UC Course  I have reviewed the triage vital signs and the nursing notes.  Pertinent labs & imaging results that were available during my care of the patient were reviewed by me and considered in my medical decision making (see chart for details).     Chest x-ray suggestive of chronic bronchitis.  Patient likely having acute on chronic bronchitis.  Chronic bronchitis likely from previous smoking history.  Patient has 2 days left of her prednisone, will continue this as this was a 12-day course.  Will not initiate any further steroids given recent steroid use and no wheezing auscultated.  Patient has been on both Augmentin and azithromycin, will defer further antibiotic therapy.  Will provide Hycodan to help with cough at night in order to get rest to help  her body fight this off.  Brompheniramine cough syrup during the day to help with production of cough and mucus.  Discussed that bronchitis can linger and cough may linger for months.  Continue to monitor temperature, symptoms,Discussed strict return precautions. Patient verbalized understanding and is agreeable with plan.  Final Clinical Impressions(s) / UC Diagnoses   Final diagnoses:  Acute bronchitis, unspecified organism     Discharge Instructions     X-ray did not show pneumonia X-ray suggestive of chronic bronchitis, likely from previous smoking and this is flared up Bronchitis cough can linger for weeks You may try brompheniramine cough syrup provided as needed during the day Hycodan cough syrup when you will be at home or bedtime-this will cause drowsiness, do not drive or work after taking   ED Prescriptions    Medication Sig Dispense Auth. Provider   HYDROcodone-homatropine (HYCODAN) 5-1.5 MG/5ML syrup Take 5 mLs by mouth every 6 (six) hours as needed for cough. Will cause drowsiness, do not drive or work after taking. 120 mL Wieters, Hallie C, PA-C   brompheniramine-pseudoephedrine-DM 30-2-10 MG/5ML syrup Take 5 mLs by mouth 4 (four) times daily as needed. Use during day 120 mL Wieters, Hallie C, PA-C     Controlled Substance Prescriptions [place] Controlled Substance Registry consulted? No   Lew DawesWieters, Hallie C, [place]PA-C 05/17/18 2046

## 2018-06-08 DIAGNOSIS — J310 Chronic rhinitis: Secondary | ICD-10-CM | POA: Diagnosis not present

## 2018-06-08 DIAGNOSIS — E6609 Other obesity due to excess calories: Secondary | ICD-10-CM | POA: Diagnosis not present

## 2018-06-08 DIAGNOSIS — J45909 Unspecified asthma, uncomplicated: Secondary | ICD-10-CM | POA: Diagnosis not present

## 2018-06-14 ENCOUNTER — Emergency Department (HOSPITAL_COMMUNITY): Payer: 59

## 2018-06-14 ENCOUNTER — Other Ambulatory Visit: Payer: Self-pay

## 2018-06-14 ENCOUNTER — Encounter (HOSPITAL_COMMUNITY): Payer: Self-pay | Admitting: Emergency Medicine

## 2018-06-14 ENCOUNTER — Emergency Department (HOSPITAL_COMMUNITY)
Admission: EM | Admit: 2018-06-14 | Discharge: 2018-06-14 | Disposition: A | Discharge status: Home / Self Care | Payer: 59 | Attending: Emergency Medicine | Admitting: Emergency Medicine

## 2018-06-14 DIAGNOSIS — Z87891 Personal history of nicotine dependence: Secondary | ICD-10-CM | POA: Diagnosis not present

## 2018-06-14 DIAGNOSIS — R059 Cough, unspecified: Secondary | ICD-10-CM

## 2018-06-14 DIAGNOSIS — R05 Cough: Secondary | ICD-10-CM | POA: Diagnosis present

## 2018-06-14 DIAGNOSIS — Z79899 Other long term (current) drug therapy: Secondary | ICD-10-CM | POA: Diagnosis not present

## 2018-06-14 HISTORY — DX: Unspecified asthma, uncomplicated: J45.909

## 2018-06-14 MED ORDER — PREDNISONE 50 MG PO TABS
60.0000 mg | ORAL_TABLET | Freq: Once | ORAL | Status: AC
  Administered 2018-06-14: 60 mg via ORAL
  Filled 2018-06-14: qty 1

## 2018-06-14 MED ORDER — ALBUTEROL SULFATE (2.5 MG/3ML) 0.083% IN NEBU
5.0000 mg | INHALATION_SOLUTION | Freq: Once | RESPIRATORY_TRACT | Status: AC
  Administered 2018-06-14: 5 mg via RESPIRATORY_TRACT
  Filled 2018-06-14: qty 6

## 2018-06-14 MED ORDER — DM-GUAIFENESIN ER 30-600 MG PO TB12
1.0000 | ORAL_TABLET | Freq: Two times a day (BID) | ORAL | Status: DC
  Administered 2018-06-14: 1 via ORAL
  Filled 2018-06-14: qty 1

## 2018-06-14 MED ORDER — PREDNISONE 20 MG PO TABS: ORAL_TABLET | ORAL | 0 refills | Status: DC

## 2018-06-14 MED ORDER — AEROCHAMBER Z-STAT PLUS/MEDIUM MISC
1.0000 | Freq: Once | Status: AC
  Administered 2018-06-14: 1

## 2018-06-14 NOTE — ED Provider Notes (Signed)
[facility] EMERGENCY DEPARTMENT Provider Note   CSN: [id number] Arrival date & time: 06/14/18  0459  Time seen 5:20 AM   History   Chief Complaint Chief Complaint  Patient presents with  . Cough    HPI Lori Duncan is a 43 y.o. female.  HPI patient states in December she started out with sinusitis and then she developed a bronchitis.  She states she was diagnosed with pneumonia without a chest x-ray.  She was treated with antibiotics and steroids, she states the steroids did help.  She states she did not fully recover however she was improving.  However about a week ago she started having increase in her cough and sometimes coughs up green or yellow mucus.  She has had some rhinorrhea.  She states her chest feels tight and heavy and she feels like she cannot take a big deep breath.  She is unsure if she is having wheezing.  She states yesterday she had a fever of 101.  She denies sore throat, nausea, vomiting, or diarrhea.  She has been prescribed an albuterol and Asmanex inhaler.  Her doctor started her on Singulair and Xyzal 1 week ago.  Patient states she quit smoking about 6 months ago and she is not around secondhand smoke.  PCP Sharilyn Sites, MD   Past Medical History:  Diagnosis Date  . Asthma   . Chlamydia 10/2009  . LGSIL (low grade squamous intraepithelial dysplasia) 11/2010  . Migraines   . Smoker   . STD (sexually transmitted disease)    Chlamydia    Patient Active Problem List   Diagnosis Date Noted  . Smoker   . Dysplasia of cervix     Past Surgical History:  Procedure Laterality Date  . CERVICAL BIOPSY  W/ LOOP ELECTRODE EXCISION    . COLPOSCOPY  11/2010  . INTRAUTERINE DEVICE INSERTION  12/2009  . LEEP  2002  . SHOULDER SURGERY Right   . TONSILLECTOMY AND ADENOIDECTOMY       OB History    Gravida  1   Para  1   Term  1   Preterm      AB      Living  1     SAB      TAB      Ectopic      Multiple      Live Births               Home Medications    Prior to Admission medications   Medication Sig Start Date End Date Taking? Authorizing Provider  albuterol (PROVENTIL HFA;VENTOLIN HFA) 108 (90 Base) MCG/ACT inhaler Inhale 2 puffs into the lungs every 4 (four) hours as needed for wheezing or shortness of breath.   Yes [provider]  ibuprofen (ADVIL,MOTRIN) 200 MG tablet Take 400-600 mg by mouth every 6 (six) hours as needed. For pain   Yes [provider]  topiramate (TOPAMAX) 100 MG tablet Take 100 mg by mouth 2 (two) times daily.   Yes [provider]  brompheniramine-pseudoephedrine-DM 30-2-10 MG/5ML syrup Take 5 mLs by mouth 4 (four) times daily as needed. Use during day 05/16/18   Wieters, Hallie C, PA-C  HYDROcodone-homatropine (HYCODAN) 5-1.5 MG/5ML syrup Take 5 mLs by mouth every 6 (six) hours as needed for cough. Will cause drowsiness, do not drive or work after taking. 05/16/18   Wieters, Hallie C, PA-C  predniSONE (DELTASONE) 20 MG tablet Take 3 po QD x 3d , then 2  po QD x 3d then 1 po QD x 3d 06/14/18   Rolland Porter, MD    Family History Family History  Problem Relation Age of Onset  . Diabetes Mother   . Hypertension Mother   . Thyroid disease Mother   . Diabetes Father   . Diabetes Paternal Grandmother   . Hypertension Paternal Grandmother     Social History Social History   Tobacco Use  . Smoking status: Former Smoker    Packs/day: 0.50  . Smokeless tobacco: Never Used  Substance Use Topics  . Alcohol use: Yes    Comment: occassionally  . Drug use: No    Types: Codeine     Allergies   Patient has no known allergies.   Review of Systems Review of Systems  All other systems reviewed and are negative.    Physical Exam Updated Vital Signs BP 136/83 (BP Location: Left Arm)   Pulse 84   Temp 98.3 F (36.8 C) (Oral)   Resp 18   Ht _0  (1.727 m)   Wt 95.3 kg   LMP 06/07/2018   SpO2 96%   BMI 31.93 kg/m   Physical Exam Vitals signs and  nursing note reviewed.  Constitutional:      General: She is not in acute distress.    Appearance: Normal appearance. She is well-developed. She is not ill-appearing or toxic-appearing.  HENT:     Head: Normocephalic and atraumatic.     Right Ear: External ear normal.     Left Ear: External ear normal.     Nose: Nose normal. No mucosal edema or rhinorrhea.     Mouth/Throat:     Mouth: Mucous membranes are moist.     Dentition: No dental abscesses.     Pharynx: Uvula midline. No pharyngeal swelling, oropharyngeal exudate, posterior oropharyngeal erythema or uvula swelling.  Eyes:     Conjunctiva/sclera: Conjunctivae normal.     Pupils: Pupils are equal, round, and reactive to light.  Neck:     Musculoskeletal: Full passive range of motion without pain, normal range of motion and neck supple.  Cardiovascular:     Rate and Rhythm: Normal rate and regular rhythm.     Heart sounds: Normal heart sounds. No murmur. No friction rub. No gallop.   Pulmonary:     Effort: Pulmonary effort is normal. No respiratory distress.     Breath sounds: Decreased air movement present. Decreased breath sounds present. No wheezing, rhonchi or rales.     Comments: Patient is coughing frequently. Chest:     Chest wall: No tenderness or crepitus.  Abdominal:     General: Bowel sounds are normal. There is no distension.     Palpations: Abdomen is soft.     Tenderness: There is no abdominal tenderness. There is no guarding or rebound.  Musculoskeletal: Normal range of motion.        General: No tenderness.     Comments: Moves all extremities well.   Skin:    General: Skin is warm and dry.     Coloration: Skin is not pale.     Findings: No erythema or rash.  Neurological:     Mental Status: She is alert and oriented to person, place, and time.     Cranial Nerves: No cranial nerve deficit.  Psychiatric:        Mood and Affect: Mood is not anxious.        Speech: Speech normal.        Behavior:  Behavior  normal.      ED Treatments / Results  Labs (all labs ordered are listed, but only abnormal results are displayed) Labs Reviewed - No data to display  EKG None  Radiology Dg Chest 2 View  Result Date: 06/14/2018 CLINICAL DATA:  Productive cough on and off since December EXAM: CHEST - 2 VIEW COMPARISON:  05/16/2018 FINDINGS: Normal heart size and mediastinal contours. No acute infiltrate or edema. No effusion or pneumothorax. No acute osseous findings. IMPRESSION: Negative chest. Electronically Signed   By: Monte Fantasia M.D.   On: 06/14/2018 06:25    Procedures Procedures (including critical care time)  Medications Ordered in ED Medications  dextromethorphan-guaiFENesin (MUCINEX DM) 30-600 MG per 12 hr tablet 1 tablet (1 tablet Oral Given 06/14/18 0636)  aerochamber Z-Stat Plus/medium 1 each (has no administration in time range)  albuterol (PROVENTIL) (2.5 MG/3ML) 0.083% nebulizer solution 5 mg (5 mg Nebulization Given 06/14/18 0559)  predniSONE (DELTASONE) tablet 60 mg (60 mg Oral Given 06/14/18 0537)     Initial Impression / Assessment and Plan / ED Course  I have reviewed the triage vital signs and the nursing notes.  Pertinent labs & imaging results that were available during my care of the patient were reviewed by me and considered in my medical decision making (see chart for details).     Patient was given albuterol nebulizer treatment to see with that would help of her complaints of chest tightness and inability to take a big deep breath although she is not wheezing or having rhonchi.  She also was given Mucinex DM for her cough and since she said the steroids it helped before she was started on prednisone.  Chest x-ray did not show any acute infiltrate.  Recheck at 640 patient states she still feels like her chest is tight however when I listen to her she has much improved air movement.  She seemed reluctant to do a second nebulizer.  We discussed staying on HER-2  inhalers and she was given a AeroChamber to make them more effective.  She should continue the allergy medicine her doctor prescribed and I am going to put her on a short course of steroids when she leaves the ED.  She can take Mucinex DM over-the-counter for her cough.  She should be reevaluated if she gets a fever.  Final Clinical Impressions(s) / ED Diagnoses   Final diagnoses:  Cough    ED Discharge Orders         Ordered    predniSONE (DELTASONE) 20 MG tablet     06/14/18 0657        mucinex DM  Plan discharge  Rolland Porter, MD, Barbette Or, MD 06/14/18 [phone]

## 2018-06-14 NOTE — ED Triage Notes (Signed)
Pt dx with PNA in December. Here today for cough and congestion. Recently seen by Dr Cain SieveGoldon and given a second inhaler. PT states she used both tonight but experienced no relief.

## 2018-06-14 NOTE — Discharge Instructions (Addendum)
You may need to use your albuterol inhaler every 4 hours.  Take the Asmanex however as prescribed, it is a preventative, it will not make you feel better if you are having acute problems.  Use the spacer with the inhalers to make them more effective.  Continue the allergy medications Dr. Phillips Odor started you on.  Take the prednisone tablets as prescribed.  Take Mucinex DM over-the-counter for your cough.  Let Dr. Phillips Odor know if you are improving in the next week, also let him know if you get a fever or seems worse.

## 2018-10-20 ENCOUNTER — Emergency Department (HOSPITAL_COMMUNITY)
Admission: EM | Admit: 2018-10-20 | Discharge: 2018-10-20 | Disposition: A | Discharge status: Home / Self Care | Payer: 59 | Attending: Emergency Medicine | Admitting: Emergency Medicine

## 2018-10-20 ENCOUNTER — Encounter (HOSPITAL_COMMUNITY): Payer: Self-pay | Admitting: Emergency Medicine

## 2018-10-20 ENCOUNTER — Other Ambulatory Visit: Payer: Self-pay

## 2018-10-20 DIAGNOSIS — R51 Headache: Secondary | ICD-10-CM | POA: Insufficient documentation

## 2018-10-20 DIAGNOSIS — J45909 Unspecified asthma, uncomplicated: Secondary | ICD-10-CM | POA: Diagnosis not present

## 2018-10-20 DIAGNOSIS — Z79899 Other long term (current) drug therapy: Secondary | ICD-10-CM | POA: Diagnosis not present

## 2018-10-20 DIAGNOSIS — R112 Nausea with vomiting, unspecified: Secondary | ICD-10-CM

## 2018-10-20 DIAGNOSIS — Z87891 Personal history of nicotine dependence: Secondary | ICD-10-CM | POA: Diagnosis not present

## 2018-10-20 DIAGNOSIS — R519 Headache, unspecified: Secondary | ICD-10-CM

## 2018-10-20 LAB — CBC WITH DIFFERENTIAL/PLATELET
Abs Immature Granulocytes: 0.04 10*3/uL (ref 0.00–0.07)
Basophils Absolute: 0.1 10*3/uL (ref 0.0–0.1)
Basophils Relative: 1 %
Eosinophils Absolute: 0.4 10*3/uL (ref 0.0–0.5)
Eosinophils Relative: 4 %
HCT: 46.4 % — ABNORMAL HIGH (ref 36.0–46.0)
Hemoglobin: 15.9 g/dL — ABNORMAL HIGH (ref 12.0–15.0)
Immature Granulocytes: 1 %
Lymphocytes Relative: 23 %
Lymphs Abs: 1.9 10*3/uL (ref 0.7–4.0)
MCH: 31.1 pg (ref 26.0–34.0)
MCHC: 34.3 g/dL (ref 30.0–36.0)
MCV: 90.8 fL (ref 80.0–100.0)
Monocytes Absolute: 0.7 10*3/uL (ref 0.1–1.0)
Monocytes Relative: 9 %
Neutro Abs: 5 10*3/uL (ref 1.7–7.7)
Neutrophils Relative %: 62 %
Platelets: 264 10*3/uL (ref 150–400)
RBC: 5.11 MIL/uL (ref 3.87–5.11)
RDW: 12 % (ref 11.5–15.5)
WBC: 8.1 10*3/uL (ref 4.0–10.5)
nRBC: 0 % (ref 0.0–0.2)

## 2018-10-20 LAB — COMPREHENSIVE METABOLIC PANEL
ALT: 18 U/L (ref 0–44)
AST: 17 U/L (ref 15–41)
Albumin: 4.2 g/dL (ref 3.5–5.0)
Alkaline Phosphatase: 64 U/L (ref 38–126)
Anion gap: 9 (ref 5–15)
BUN: 13 mg/dL (ref 6–20)
CO2: 24 mmol/L (ref 22–32)
Calcium: 9.1 mg/dL (ref 8.9–10.3)
Chloride: 107 mmol/L (ref 98–111)
Creatinine, Ser: 0.63 mg/dL (ref 0.44–1.00)
GFR calc Af Amer: >60 mL/min (ref >60)
GFR calc non Af Amer: >60 mL/min (ref >60)
Glucose, Bld: 116 mg/dL — ABNORMAL HIGH (ref 70–99)
Potassium: 3.9 mmol/L (ref 3.5–5.1)
Sodium: 140 mmol/L (ref 135–145)
Total Bilirubin: 0.3 mg/dL (ref 0.3–1.2)
Total Protein: 7 g/dL (ref 6.5–8.1)

## 2018-10-20 LAB — I-STAT BETA HCG BLOOD, ED (MC, WL, AP ONLY): I-stat hCG, quantitative: <5 m[IU]/mL (ref <5)

## 2018-10-20 LAB — URINALYSIS, ROUTINE W REFLEX MICROSCOPIC
Bilirubin Urine: NEGATIVE
Glucose, UA: NEGATIVE mg/dL
Ketones, ur: NEGATIVE mg/dL
Leukocytes,Ua: NEGATIVE
Nitrite: NEGATIVE
Protein, ur: NEGATIVE mg/dL
Specific Gravity, Urine: 1.013 (ref 1.005–1.030)
pH: 7 (ref 5.0–8.0)

## 2018-10-20 LAB — LIPASE, BLOOD: Lipase: 44 U/L (ref 11–51)

## 2018-10-20 MED ORDER — DIPHENHYDRAMINE HCL 50 MG/ML IJ SOLN
50.0000 mg | Freq: Once | INTRAMUSCULAR | Status: AC
  Administered 2018-10-20: 50 mg via INTRAVENOUS
  Filled 2018-10-20: qty 1

## 2018-10-20 MED ORDER — KETOROLAC TROMETHAMINE 15 MG/ML IJ SOLN
15.0000 mg | Freq: Once | INTRAMUSCULAR | Status: AC
  Administered 2018-10-20: 15 mg via INTRAVENOUS
  Filled 2018-10-20: qty 1

## 2018-10-20 MED ORDER — METOCLOPRAMIDE HCL 5 MG/ML IJ SOLN
10.0000 mg | Freq: Once | INTRAMUSCULAR | Status: AC
Start: 2018-10-20 — End: 2018-10-20
  Administered 2018-10-20: 10 mg via INTRAVENOUS
  Filled 2018-10-20: qty 2

## 2018-10-20 MED ORDER — PROCHLORPERAZINE EDISYLATE 10 MG/2ML IJ SOLN
10.0000 mg | Freq: Once | INTRAMUSCULAR | Status: AC
  Administered 2018-10-20: 10 mg via INTRAVENOUS
  Filled 2018-10-20: qty 2

## 2018-10-20 MED ORDER — SODIUM CHLORIDE 0.9 % IV BOLUS
500.0000 mL | Freq: Once | INTRAVENOUS | Status: AC
  Administered 2018-10-20: 500 mL via INTRAVENOUS

## 2018-10-20 MED ORDER — MAGNESIUM SULFATE IN D5W 1-5 GM/100ML-% IV SOLN
1.0000 g | Freq: Once | INTRAVENOUS | Status: AC
  Administered 2018-10-20: 1 g via INTRAVENOUS
  Filled 2018-10-20: qty 100

## 2018-10-20 MED ORDER — METOCLOPRAMIDE HCL 10 MG PO TABS: 10.0000 mg | ORAL_TABLET | Freq: Four times a day (QID) | ORAL | 0 refills | Status: DC | PRN

## 2018-10-20 NOTE — ED Notes (Signed)
Patient given ginger ale at this time. 

## 2018-10-20 NOTE — Discharge Instructions (Signed)
Continue treating the headache as you which are typical migraine, taking your home medications, Tylenol, ibuprofen. You may use Reglan as needed for nausea, vomiting, or headache. Make sure you are staying well-hydrated water. Follow-up with your primary care doctor if her symptoms persist. Return to the emergency room if you develop worsening headache, fever, vision changes, difficulty moving your head, severe worsening abdominal pain, or any new, worsening, or concerning symptoms.

## 2018-10-20 NOTE — ED Triage Notes (Signed)
Pt from home.  States having vomiting, fever, body aches, and headache starting at 1000 am this morning

## 2018-10-20 NOTE — ED Provider Notes (Signed)
[facility] EMERGENCY DEPARTMENT Provider Note   CSN: [id number] Arrival date & time: 10/20/18  1642    History   Chief Complaint Chief Complaint  Patient presents with  . Emesis  . Headache    HPI Lori Duncan is a 43 y.o. female presenting for evaluation of nausea, vomiting, headache.  Patient states earlier this morning she started to feel very nauseous.  She vomited 6-7 times, before taking Zofran.  Patient reports she still feeling nauseous, but she has not vomited.  While she was throwing up, she started to develop a headache, she states it has gradually worsened.  She took her home medication including her triptan, reports no improvement of her headache.  Headache is generalized, but worse on the left side.  She denies vision changes, slurred speech, photophobia, phonophobia, neck pain or stiffness, chest pain, shortness of breath, cough, abdominal pain, urinary symptoms, abnormal bowel movements.  She denies sick contacts.  She denies known COVID-19 exposure.  She denies tobacco, alcohol, or drug use.  Patient states she just finished her period, she often has migrates when she finishes her period.  However she states today's headache feels different than her typical migraine. She takes allergy medication daily, no there medications.      HPI  Past Medical History:  Diagnosis Date  . Asthma   . Chlamydia 10/2009  . LGSIL (low grade squamous intraepithelial dysplasia) 11/2010  . Migraines   . Smoker   . STD (sexually transmitted disease)    Chlamydia    Patient Active Problem List   Diagnosis Date Noted  . Smoker   . Dysplasia of cervix     Past Surgical History:  Procedure Laterality Date  . CERVICAL BIOPSY  W/ LOOP ELECTRODE EXCISION    . COLPOSCOPY  11/2010  . INTRAUTERINE DEVICE INSERTION  12/2009  . LEEP  2002  . SHOULDER SURGERY Right   . TONSILLECTOMY AND ADENOIDECTOMY       OB History    Gravida  1   Para  1   Term  1   Preterm      AB      Living  1     SAB      TAB      Ectopic      Multiple      Live Births               Home Medications    Prior to Admission medications   Medication Sig Start Date End Date Taking? Authorizing Provider  albuterol (PROVENTIL HFA;VENTOLIN HFA) 108 (90 Base) MCG/ACT inhaler Inhale 2 puffs into the lungs every 4 (four) hours as needed for wheezing or shortness of breath.   Yes [provider]  eletriptan (RELPAX) 40 MG tablet Take 1 tablet by mouth 2 (two) times daily as needed.   Yes [provider]  ibuprofen (ADVIL,MOTRIN) 200 MG tablet Take 400-600 mg by mouth every 6 (six) hours as needed. For pain   Yes [provider]  montelukast (SINGULAIR) 10 MG tablet Take 1 tablet by mouth daily.   Yes [provider]  ondansetron (ZOFRAN-ODT) 4 MG disintegrating tablet Take 1 tablet by mouth 2 (two) times daily as needed.   Yes [provider]  metoCLOPramide (REGLAN) 10 MG tablet Take 1 tablet (10 mg total) by mouth every 6 (six) hours as needed for nausea. 10/20/18   De Libman, PA-C    Family History Family History  Problem Relation Age  of Onset  . Diabetes Mother   . Hypertension Mother   . Thyroid disease Mother   . Diabetes Father   . Diabetes Paternal Grandmother   . Hypertension Paternal Grandmother     Social History Social History   Tobacco Use  . Smoking status: Former Smoker    Packs/day: 0.50  . Smokeless tobacco: Never Used  Substance Use Topics  . Alcohol use: Yes    Comment: occassionally  . Drug use: No    Types: Codeine     Allergies   Patient has no known allergies.   Review of Systems Review of Systems  Gastrointestinal: Positive for nausea and vomiting (resolved).  Neurological: Positive for headaches.  All other systems reviewed and are negative.    Physical Exam Updated Vital Signs BP (!) 101/55   Pulse 65   Temp 98.5 F (36.9 C) (Oral)   Resp 18   Ht 5\' 8"  (1.727 m)    Wt 97.5 kg   SpO2 98%   BMI 32.69 kg/m   Physical Exam Vitals signs and nursing note reviewed.  Constitutional:      General: She is not in acute distress.    Appearance: She is well-developed.     Comments: Appears nontoxic  HENT:     Head: Normocephalic and atraumatic.  Eyes:     Extraocular Movements: Extraocular movements intact.     Conjunctiva/sclera: Conjunctivae normal.     Pupils: Pupils are equal, round, and reactive to light.     Comments: EOMI and PERRLA. No nystagmus  Neck:     Musculoskeletal: Normal range of motion and neck supple.     Comments: No neck stiffness, tenderness, or signs of meningismus. Cardiovascular:     Rate and Rhythm: Normal rate and regular rhythm.     Pulses: Normal pulses.  Pulmonary:     Effort: Pulmonary effort is normal. No respiratory distress.     Breath sounds: Normal breath sounds. No wheezing.     Comments: Speaking in full sentences.  Clear lung sounds in all fields. Abdominal:     General: There is no distension.     Palpations: Abdomen is soft. There is no mass.     Tenderness: There is no abdominal tenderness. There is no guarding or rebound.     Comments: No tenderness palpation the abdomen.  Soft without rigidity, guarding, distention.  Negative rebound.  No peritonitis.  Musculoskeletal: Normal range of motion.  Skin:    General: Skin is warm and dry.     Capillary Refill: Capillary refill takes less than 2 seconds.  Neurological:     Mental Status: She is alert and oriented to person, place, and time.     Comments: No obvious neuro deficits.  CN intact.  Nose to finger intact.  Fine movement and coordination intact.      ED Treatments / Results  Labs (all labs ordered are listed, but only abnormal results are displayed) Labs Reviewed  CBC WITH DIFFERENTIAL/PLATELET - Abnormal; Notable for the following components:      Result Value   Hemoglobin 15.9 (*)    HCT 46.4 (*)    All other components within normal  limits  COMPREHENSIVE METABOLIC PANEL - Abnormal; Notable for the following components:   Glucose, Bld 116 (*)    All other components within normal limits  URINALYSIS, ROUTINE W REFLEX MICROSCOPIC - Abnormal; Notable for the following components:   Hgb urine dipstick SMALL (*)    Bacteria, UA RARE (*)  All other components within normal limits  LIPASE, BLOOD  I-STAT BETA HCG BLOOD, ED ([facility], [facility], [facility] ONLY)    EKG None  Radiology No results found.  Procedures Procedures (including critical care time)  Medications Ordered in ED Medications  metoCLOPramide (REGLAN) injection 10 mg (10 mg Intravenous Given 10/20/18 1745)  ketorolac (TORADOL) 15 MG/ML injection 15 mg (15 mg Intravenous Given 10/20/18 1743)  sodium chloride 0.9 % bolus 500 mL (0 mLs Intravenous Stopped 10/20/18 1856)  prochlorperazine (COMPAZINE) injection 10 mg (10 mg Intravenous Given 10/20/18 1846)  magnesium sulfate IVPB 1 g 100 mL (0 g Intravenous Stopped 10/20/18 1946)  diphenhydrAMINE (BENADRYL) injection 50 mg (50 mg Intravenous Given 10/20/18 1850)     Initial Impression / Assessment and Plan / ED Course  I have reviewed the triage vital signs and the nursing notes.  Pertinent labs & imaging results that were available during my care of the patient were reviewed by me and considered in my medical decision making (see chart for details).        Patient presenting for evaluation nausea, vomiting, headache.  Physical exam reassuring, no obvious neurologic deficits.  Symptoms began with nausea and vomiting.  Patient with a history of migraines, which could be the cause of her symptoms.  Also consider intra-abdominal infections, dehydration, or viral illness.  Low suspicion for intracranial emergency such as ICH, SAH, CVA, meningitis.  Will give headache cocktail and reassess.  Labs reassuring, no leukocytosis.  Slightly hemoconcentrated, consistent with dehydration.  Electrolyte stable.  UA negative.  On reassessment,  patient reports symptoms are much improved. As such, will hold on further head imaging or w/u. While pt reports improvement of nausea and headache, both are still present/mild.  As such, will give second round of medication, p.o. challenge, and reassess.  On reassessment, patient reports further improvement of her symptoms.  She is tolerating p.o. without difficulty.  Discussed continued symptomatic treatment at home, follow-up with PCP for further evaluation.  Discussed return precautions.  At this time, patient appears safe for discharge.  Patient states she understands agrees plan.  Final Clinical Impressions(s) / ED Diagnoses   Final diagnoses:  Acute nonintractable headache, unspecified headache type  Non-intractable vomiting with nausea, unspecified vomiting type    ED Discharge Orders         Ordered    metoCLOPramide (REGLAN) 10 MG tablet  Every 6 hours PRN     10/20/18 1939           Alveria Apley, PA-C 10/20/18 2007    Maia Plan, MD 10/21/18 1236

## 2018-11-29 ENCOUNTER — Ambulatory Visit: Payer: Self-pay

## 2018-11-29 ENCOUNTER — Other Ambulatory Visit: Payer: 59

## 2018-11-29 DIAGNOSIS — Z20822 Contact with and (suspected) exposure to covid-19: Secondary | ICD-10-CM

## 2018-11-29 NOTE — Telephone Encounter (Signed)
Pt called stating that she was instructed By Dr Hilma Favors to call us for COVID-19 testing. Pt states that she has just returned from [facility] vacation and has cough, chest feels tight and some mild SOB. Care advice was read to patient. Patient verbalized understanding of all instructions. Pt is a [facility] resident. Appointment was scheduled and order placed.  Reason for Disposition . Chest pain or pressure  Answer Assessment - Initial Assessment Questions 1. COVID-19 DIAGNOSIS: "Who made your Coronavirus (COVID-19) diagnosis?" "Was it confirmed by a positive lab test?" If not diagnosed by a HCP, ask "Are there lots of cases (community spread) where you live?" (See public health department website, if unsure)     [place] 2. ONSET: "When did the COVID-19 symptoms start?"     friday 3. WORST SYMPTOM: "What is your worst symptom?" (e.g., cough, fever, shortness of breath, muscle aches)    Headache, chest tightness 4. COUGH: "Do you have a cough?" If so, ask: "How bad is the cough?"      Yes bad coughing spells 5. FEVER: "Do you have a fever?" If so, ask: "What is your temperature, how was it measured, and when did it start?"     No 99 was the highest 6. RESPIRATORY STATUS: "Describe your breathing?" (e.g., shortness of breath, wheezing, unable to speak)      Sob tightness 7. BETTER-SAME-WORSE: "Are you getting better, staying the same or getting worse compared to yesterday?"  If getting worse, ask, "In what way?"   worse 8. HIGH RISK DISEASE: "Do you have any chronic medical problems?" (e.g., asthma, heart or lung disease, weak immune system, etc.)     Allergies only 9. PREGNANCY: "Is there any chance you are pregnant?" "When was your last menstrual period?"    No IUD 10. OTHER SYMPTOMS: "Do you have any other symptoms?"  (e.g., chills, fatigue, headache, loss of smell or taste, muscle pain, sore throat)       Sore throat tired,headache,  Protocols used: CORONAVIRUS (COVID-19) DIAGNOSED OR  SUSPECTED-A-AH

## 2018-12-03 LAB — NOVEL CORONAVIRUS, NAA: SARS-CoV-2, NAA: NOT DETECTED

## 2018-12-07 ENCOUNTER — Telehealth: Payer: Self-pay | Admitting: General Practice

## 2018-12-07 NOTE — Telephone Encounter (Signed)
Negative results was given to pt °

## 2019-10-22 IMAGING — DX DG CHEST 2V
2 series · 2 of 2 positions shown · non-contrast
Comparison: 09/13/2016, 06/05/2014.

CLINICAL DATA: One month history of cough which was not relieved by
2 separate courses of antibiotic therapy. Former smoker.

EXAM:
CHEST - 2 VIEW

[chest pa]
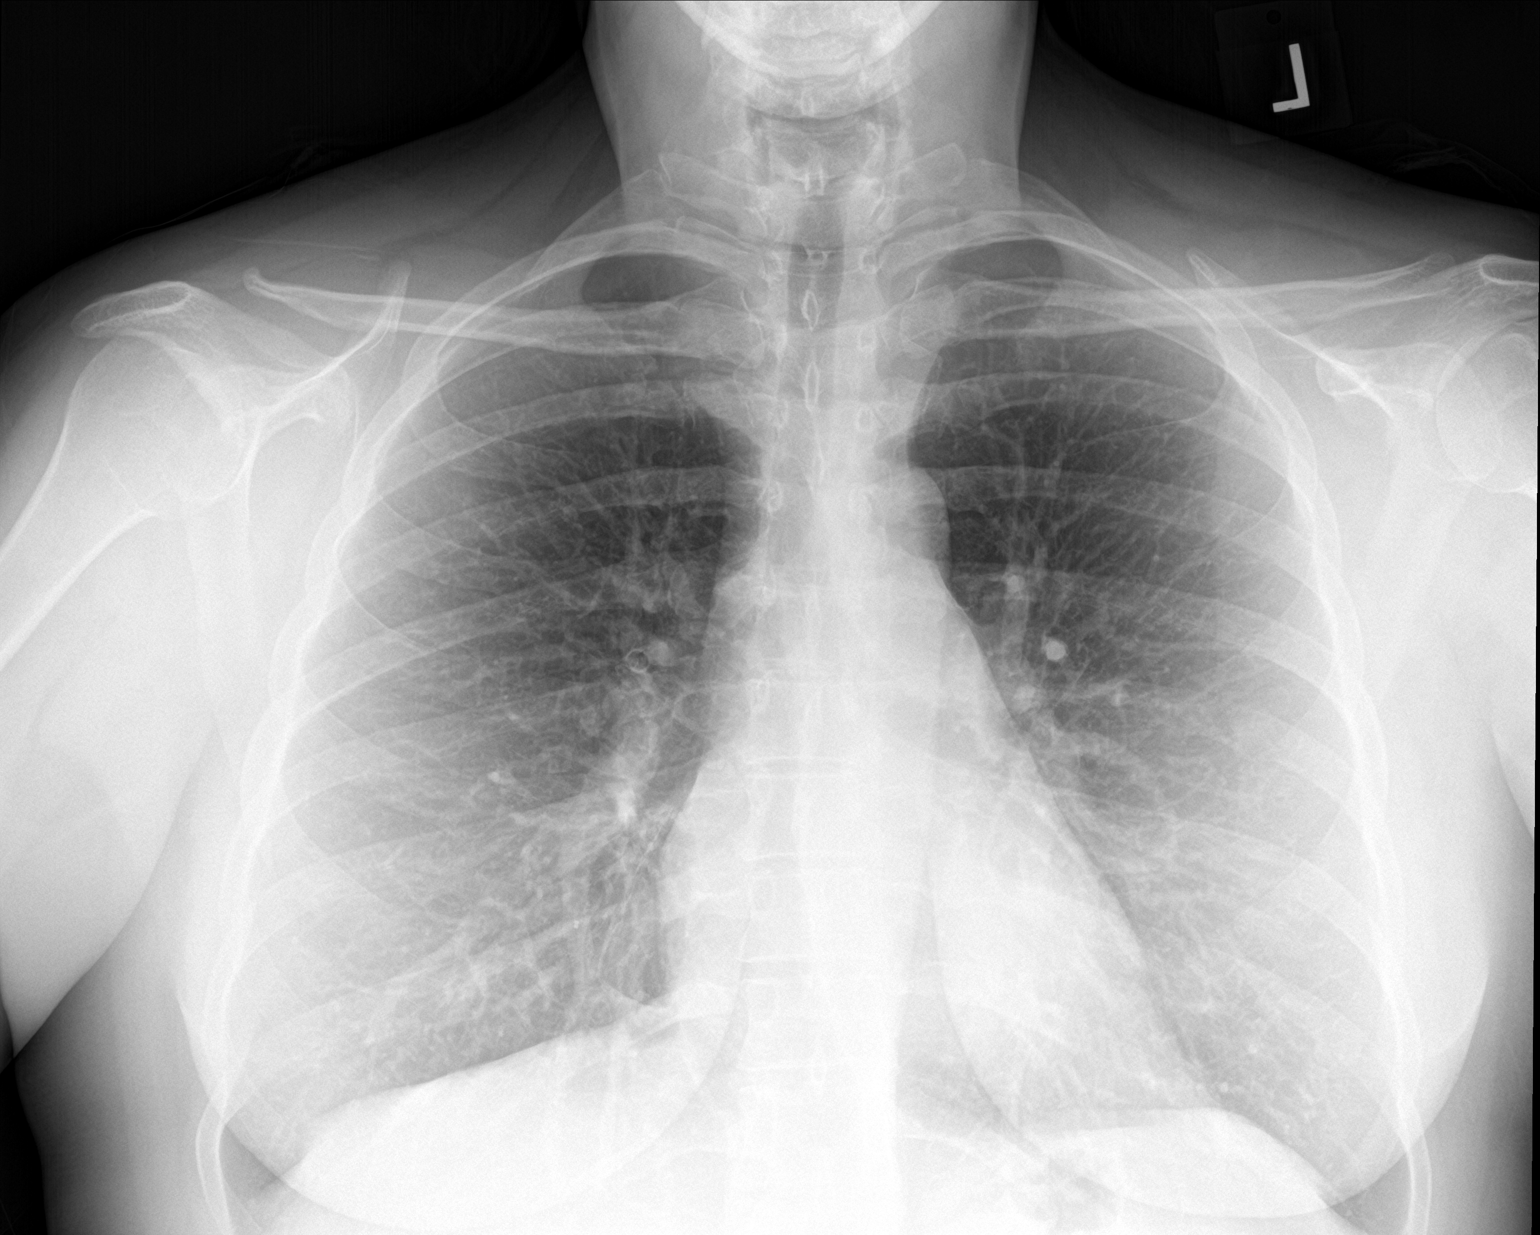

[chest lat]
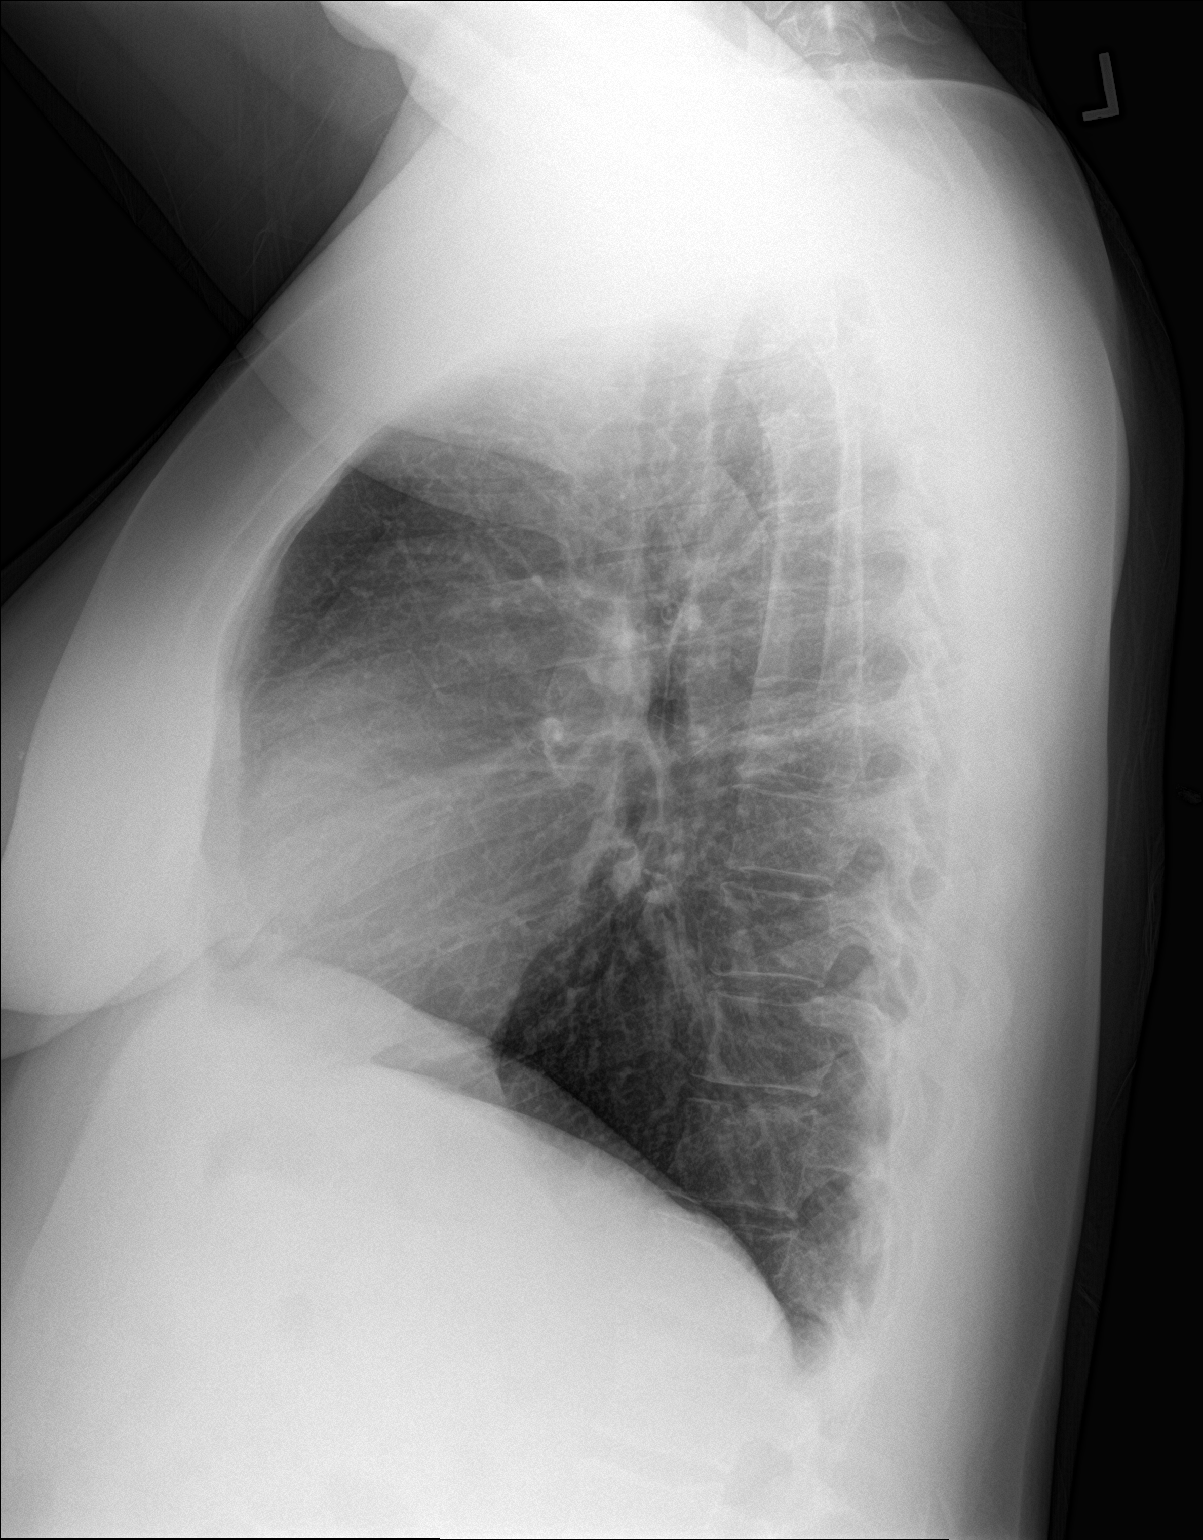

[2 of 2 positions shown; findings below may reference images not displayed]

FINDINGS: Cardiomediastinal silhouette unremarkable and unchanged. Mildly
prominent bronchovascular markings diffusely and mild central
peribronchial thickening, unchanged. Lungs otherwise clear. No
localized airspace consolidation. No pleural effusions. No
pneumothorax. Normal pulmonary vascularity. Visualized bony thorax
intact.
IMPRESSION: Stable mild changes of chronic bronchitis and/or asthma. No acute
cardiopulmonary disease.

## 2019-11-20 IMAGING — DX DG CHEST 2V
2 series · 2 of 2 positions shown · non-contrast
Comparison: 05/16/2018

CLINICAL DATA: Productive cough on and off since [REDACTED]

EXAM:
CHEST - 2 VIEW

[chest pa]
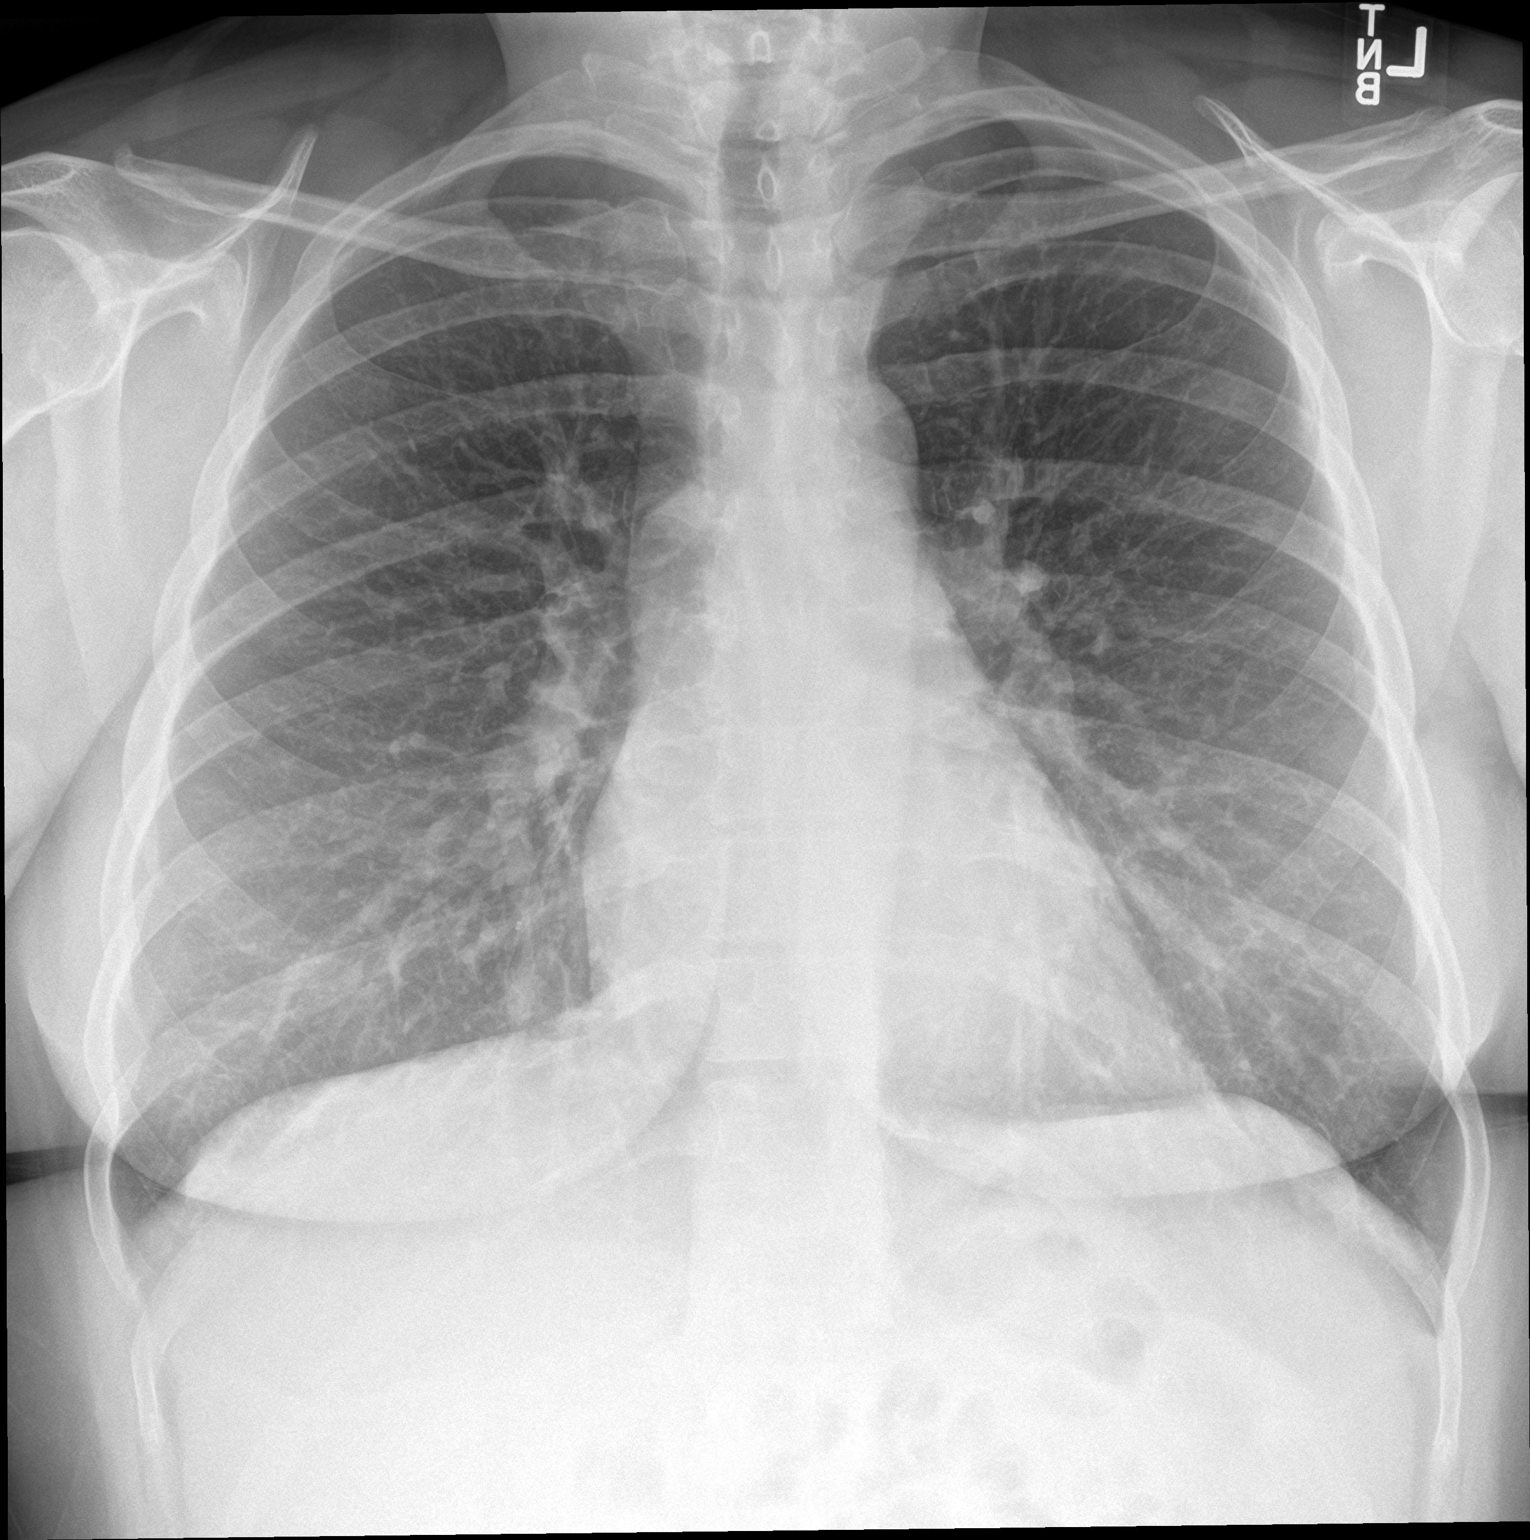

[chest lat]
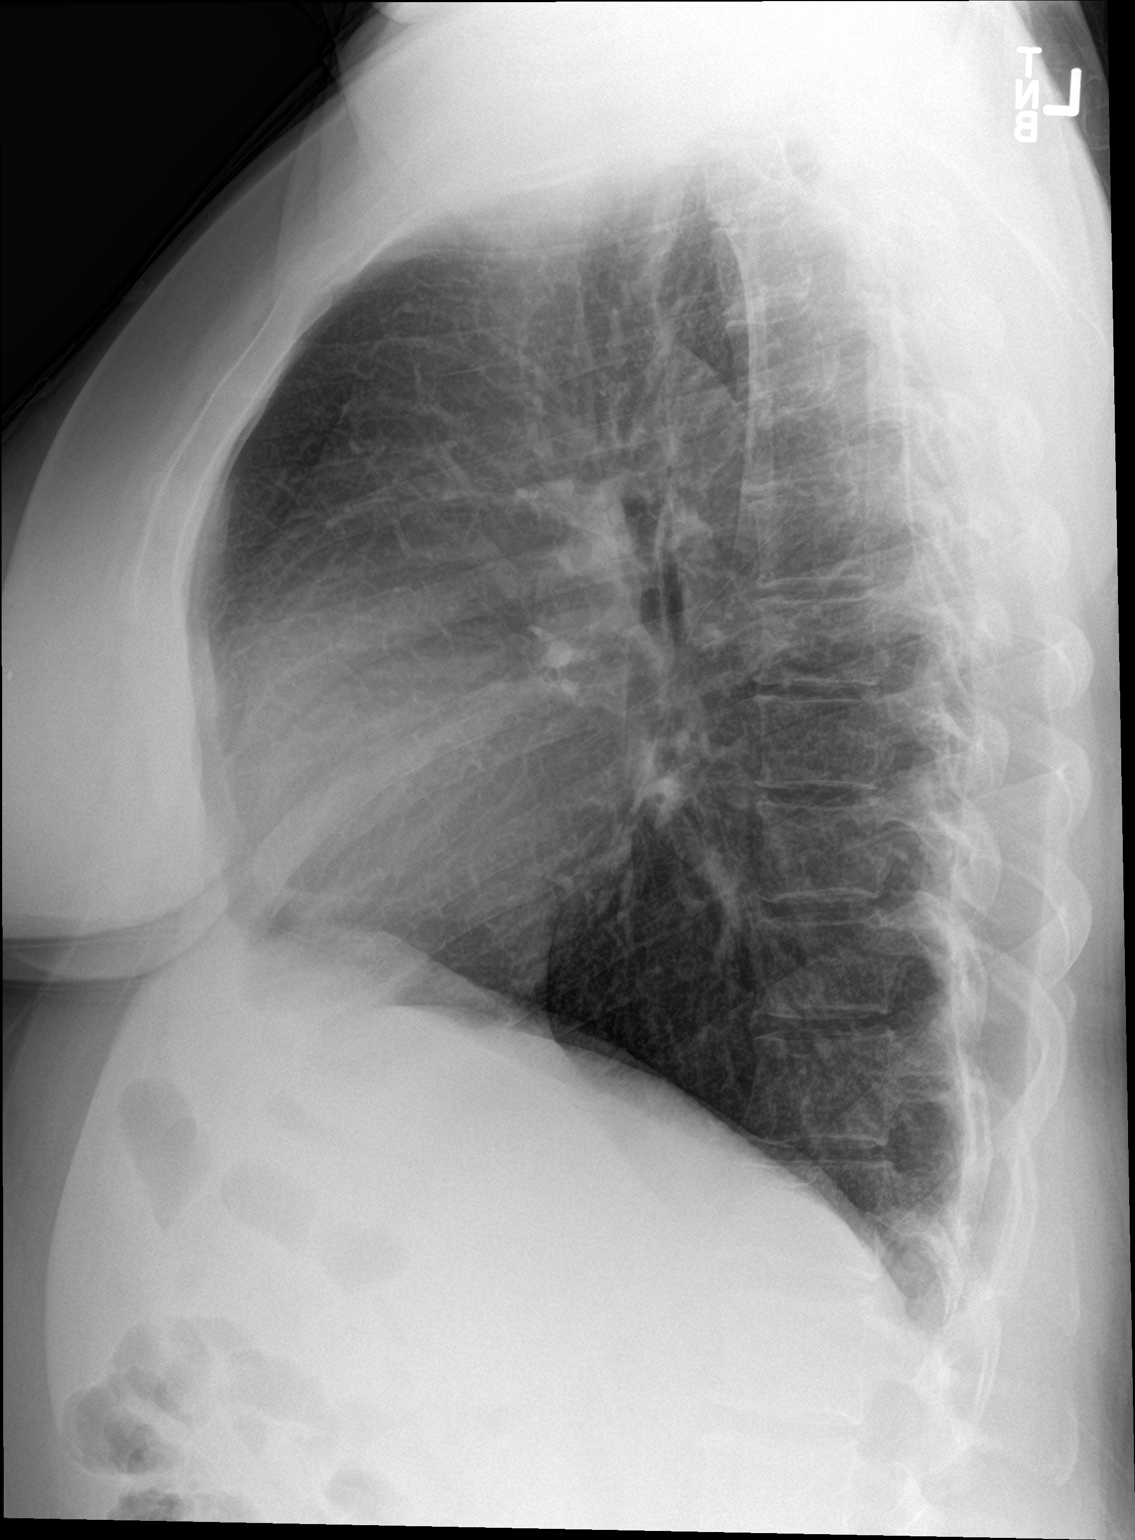

[2 of 2 positions shown; findings below may reference images not displayed]

FINDINGS: Normal heart size and mediastinal contours. No acute infiltrate or
edema. No effusion or pneumothorax. No acute osseous findings.
IMPRESSION: Negative chest.

## 2020-08-28 ENCOUNTER — Ambulatory Visit
Admission: EM | Admit: 2020-08-28 | Discharge: 2020-08-28 | Disposition: A | Discharge status: Home / Self Care | Payer: 59 | Attending: Emergency Medicine | Admitting: Emergency Medicine

## 2020-08-28 ENCOUNTER — Encounter: Payer: Self-pay | Admitting: Emergency Medicine

## 2020-08-28 ENCOUNTER — Other Ambulatory Visit: Payer: Self-pay

## 2020-08-28 DIAGNOSIS — R3989 Other symptoms and signs involving the genitourinary system: Secondary | ICD-10-CM | POA: Diagnosis present

## 2020-08-28 DIAGNOSIS — R350 Frequency of micturition: Secondary | ICD-10-CM

## 2020-08-28 LAB — POCT URINALYSIS DIP (MANUAL ENTRY)
Bilirubin, UA: NEGATIVE
Glucose, UA: NEGATIVE mg/dL
Leukocytes, UA: NEGATIVE
Nitrite, UA: NEGATIVE
Protein Ur, POC: NEGATIVE mg/dL
Spec Grav, UA: 1.01
Urobilinogen, UA: 0.2 U/dL
pH, UA: 7

## 2020-08-28 MED ORDER — PHENAZOPYRIDINE HCL 200 MG PO TABS: 200.0000 mg | ORAL_TABLET | Freq: Three times a day (TID) | ORAL | 0 refills | Status: DC

## 2020-08-28 MED ORDER — CEPHALEXIN 500 MG PO CAPS: 500.0000 mg | ORAL_CAPSULE | Freq: Two times a day (BID) | ORAL | 0 refills | Status: AC

## 2020-08-28 NOTE — ED Triage Notes (Signed)
Fever, nausea, frequent urination, pelvic pain, lower back pain x 4 days.

## 2020-08-28 NOTE — ED Provider Notes (Signed)
[facility]   CC: UTI  SUBJECTIVE:  Lori Duncan is a 45 y.o. female who complains of urinary frequency, bladder pressure, and discomfort with urination x 4 days.  Patient denies a precipitating event, recent sexual encounter, excessive caffeine intake.  Has tried pushing fluids with minimal relief.  Symptoms are made worse with urination.  Admits to similar symptoms in the past.  Complains of associated fever, low back pain, and nausea.  Denies chills, vomiting, abdominal pain, flank pain, abnormal vaginal discharge or bleeding, hematuria.    LMP: Patient's last menstrual period was 08/21/2020.  ROS: As in HPI.  All other pertinent ROS negative.     Past Medical History:  Diagnosis Date  . Asthma   . Chlamydia 10/2009  . LGSIL (low grade squamous intraepithelial dysplasia) 11/2010  . Migraines   . Smoker   . STD (sexually transmitted disease)    Chlamydia   Past Surgical History:  Procedure Laterality Date  . CERVICAL BIOPSY  W/ LOOP ELECTRODE EXCISION    . COLPOSCOPY  11/2010  . INTRAUTERINE DEVICE INSERTION  12/2009  . LEEP  2002  . SHOULDER SURGERY Right   . TONSILLECTOMY AND ADENOIDECTOMY     No Known Allergies No current facility-administered medications on file prior to encounter.   Current Outpatient Medications on File Prior to Encounter  Medication Sig Dispense Refill  . albuterol (PROVENTIL HFA;VENTOLIN HFA) 108 (90 Base) MCG/ACT inhaler Inhale 2 puffs into the lungs every 4 (four) hours as needed for wheezing or shortness of breath.    Marland Kitchen ibuprofen (ADVIL,MOTRIN) 200 MG tablet Take 400-600 mg by mouth every 6 (six) hours as needed. For pain    . [DISCONTINUED] eletriptan (RELPAX) 40 MG tablet Take 1 tablet by mouth 2 (two) times daily as needed.    . [DISCONTINUED] metoCLOPramide (REGLAN) 10 MG tablet Take 1 tablet (10 mg total) by mouth every 6 (six) hours as needed for nausea. 30 tablet 0  . [DISCONTINUED] montelukast (SINGULAIR) 10 MG tablet  Take 1 tablet by mouth daily.     Social History   Socioeconomic History  . Marital status: Married    Spouse name: Not on file  . Number of children: Not on file  . Years of education: Not on file  . Highest education level: Not on file  Occupational History  . Not on file  Tobacco Use  . Smoking status: Former Smoker    Packs/day: 0.50  . Smokeless tobacco: Never Used  Vaping Use  . Vaping Use: Never used  Substance and Sexual Activity  . Alcohol use: Yes    Comment: occassionally  . Drug use: No    Types: Codeine  . Sexual activity: Yes    Birth control/protection: I.U.D.    Comment: MIRENA 12-2009  Other Topics Concern  . Not on file  Social History Narrative  . Not on file   Social Determinants of Health   Financial Resource Strain: Not on file  Food Insecurity: Not on file  Transportation Needs: Not on file  Physical Activity: Not on file  Stress: Not on file  Social Connections: Not on file  Intimate Partner Violence: Not on file   Family History  Problem Relation Age of Onset  . Diabetes Mother   . Hypertension Mother   . Thyroid disease Mother   . Diabetes Father   . Diabetes Paternal Grandmother   . Hypertension Paternal Grandmother     OBJECTIVE:  Vitals:   08/28/20 1203  BP: (!) 142/83  Pulse: 83  Resp: 18  Temp: 98.7 F (37.1 C)  TempSrc: Oral  SpO2: 96%   General appearance: AOx3 in no acute distress HEENT: NCAT.  Oropharynx clear.  Lungs: clear to auscultation bilaterally without adventitious breath sounds Heart: regular rate and rhythm.   Abdomen: soft; non-distended; no tenderness; bowel sounds present; no guarding Back: no CVA tenderness Extremities: no edema; symmetrical with no gross deformities Skin: warm and dry Neurologic: Ambulates from chair to exam table without difficulty Psychological: alert and cooperative; normal mood and affect  Labs Reviewed  POCT URINALYSIS DIP (MANUAL ENTRY) - Abnormal; Notable for the  following components:      Result Value   Ketones, POC UA small (15) (*)    Blood, UA small (*)    All other components within normal limits    ASSESSMENT & PLAN:  1. Urinary frequency   2. Sensation of pressure in bladder area     Meds ordered this encounter  Medications  . cephALEXin (KEFLEX) 500 MG capsule    Sig: Take 1 capsule (500 mg total) by mouth 2 (two) times daily for 10 days.    Dispense:  20 capsule    Refill:  0    Order Specific Question:   Supervising Provider    Answer:   Eustace Moore [[id number]]  . phenazopyridine (PYRIDIUM) 200 MG tablet    Sig: Take 1 tablet (200 mg total) by mouth 3 (three) times daily.    Dispense:  6 tablet    Refill:  0    Order Specific Question:   Supervising Provider    Answer:   Eustace Moore [[id number]]   Urine concerning for UTI Urine culture sent.  We will call you with abnormal results.   Push fluids and get plenty of rest.   Take antibiotic as directed and to completion Take pyridium as prescribed and as needed for symptomatic relief Follow up with PCP if symptoms persists Return here or go to ER if you have any new or worsening symptoms such as fever, worsening abdominal pain, nausea/vomiting, flank pain, etc...  Outlined signs and symptoms indicating need for more acute intervention. Patient verbalized understanding. After Visit Summary given.     Rennis Harding, PA-C 08/28/20 1237

## 2020-08-28 NOTE — Discharge Instructions (Signed)
Urine concerning for UTI Urine culture sent.  We will call you with abnormal results.   Push fluids and get plenty of rest.   Take antibiotic as directed and to completion Take pyridium as prescribed and as needed for symptomatic relief Follow up with PCP if symptoms persists Return here or go to ER if you have any new or worsening symptoms such as fever, worsening abdominal pain, nausea/vomiting, flank pain, etc... 

## 2020-08-30 LAB — URINE CULTURE: Culture: 20000 — AB

## 2021-04-28 ENCOUNTER — Other Ambulatory Visit: Payer: Self-pay

## 2021-04-28 ENCOUNTER — Encounter: Payer: Self-pay | Admitting: Emergency Medicine

## 2021-04-28 ENCOUNTER — Ambulatory Visit
Admission: EM | Admit: 2021-04-28 | Discharge: 2021-04-28 | Disposition: A | Discharge status: Home / Self Care | Payer: 59 | Attending: Urgent Care | Admitting: Urgent Care

## 2021-04-28 ENCOUNTER — Ambulatory Visit (INDEPENDENT_AMBULATORY_CARE_PROVIDER_SITE_OTHER): Payer: 59

## 2021-04-28 DIAGNOSIS — R053 Chronic cough: Secondary | ICD-10-CM | POA: Diagnosis not present

## 2021-04-28 DIAGNOSIS — R059 Cough, unspecified: Secondary | ICD-10-CM | POA: Diagnosis not present

## 2021-04-28 DIAGNOSIS — J4520 Mild intermittent asthma, uncomplicated: Secondary | ICD-10-CM

## 2021-04-28 MED ORDER — PROMETHAZINE-DM 6.25-15 MG/5ML PO SYRP: 5.0000 mL | ORAL_SOLUTION | Freq: Every evening | ORAL | 0 refills | Status: DC | PRN

## 2021-04-28 MED ORDER — BENZONATATE 100 MG PO CAPS: 100.0000 mg | ORAL_CAPSULE | Freq: Three times a day (TID) | ORAL | 0 refills | Status: DC | PRN

## 2021-04-28 MED ORDER — ALBUTEROL SULFATE HFA 108 (90 BASE) MCG/ACT IN AERS: 2.0000 | INHALATION_SPRAY | RESPIRATORY_TRACT | 0 refills | Status: AC | PRN

## 2021-04-28 NOTE — ED Provider Notes (Signed)
[facility]   MRN: [id number] DOB: Mar 24, 1976  Subjective:   Lori Duncan is a 45 y.o. female presenting for 1 month history of persistent coughing.  Initially felt like it was in her sinuses but feels like now it has moved into her chest.  She has been seen twice for this, has not had a chest x-ray done.  She was prescribed 2 rounds of antibiotics.  The first round she believes was azithromycin.  The second round was Bactrim and was also prescribed a steroid course.  She does have a history of asthma.  Does not have an active albuterol inhaler that she can use.  Patient is not smoking.  No current facility-administered medications for this encounter.  Current Outpatient Medications:    albuterol (PROVENTIL HFA;VENTOLIN HFA) 108 (90 Base) MCG/ACT inhaler, Inhale 2 puffs into the lungs every 4 (four) hours as needed for wheezing or shortness of breath., Disp: , Rfl:    ibuprofen (ADVIL,MOTRIN) 200 MG tablet, Take 400-600 mg by mouth every 6 (six) hours as needed. For pain, Disp: , Rfl:    phenazopyridine (PYRIDIUM) 200 MG tablet, Take 1 tablet (200 mg total) by mouth 3 (three) times daily., Disp: 6 tablet, Rfl: 0   No Known Allergies  Past Medical History:  Diagnosis Date   Asthma    Chlamydia 10/2009   LGSIL (low grade squamous intraepithelial dysplasia) 11/2010   Migraines    Smoker    STD (sexually transmitted disease)    Chlamydia     Past Surgical History:  Procedure Laterality Date   CERVICAL BIOPSY  W/ LOOP ELECTRODE EXCISION     COLPOSCOPY  11/2010   INTRAUTERINE DEVICE INSERTION  12/2009   LEEP  2002   SHOULDER SURGERY Right    TONSILLECTOMY AND ADENOIDECTOMY      Family History  Problem Relation Age of Onset   Diabetes Mother    Hypertension Mother    Thyroid disease Mother    Diabetes Father    Diabetes Paternal Grandmother    Hypertension Paternal Grandmother     Social History   Tobacco Use   Smoking status: Former     Packs/day: 0.50    Types: Cigarettes   Smokeless tobacco: Never  Vaping Use   Vaping Use: Never used  Substance Use Topics   Alcohol use: Yes    Comment: occassionally   Drug use: No    Types: Codeine    ROS   Objective:   Vitals: BP 126/79 (BP Location: Right Arm)   Pulse (!) 103   Temp 98.8 F (37.1 C) (Oral)   Resp 18   LMP 04/18/2021 (Approximate)   SpO2 94%   Physical Exam Constitutional:      General: She is not in acute distress.    Appearance: Normal appearance. She is well-developed. She is not ill-appearing, toxic-appearing or diaphoretic.  HENT:     Head: Normocephalic and atraumatic.     Nose: Nose normal.     Mouth/Throat:     Mouth: Mucous membranes are moist.  Eyes:     Extraocular Movements: Extraocular movements intact.     Pupils: Pupils are equal, round, and reactive to light.  Cardiovascular:     Rate and Rhythm: Normal rate and regular rhythm.     Pulses: Normal pulses.     Heart sounds: Normal heart sounds. No murmur heard.   No friction rub. No gallop.  Pulmonary:     Effort: Pulmonary effort is normal. No respiratory  distress.     Breath sounds: Normal breath sounds. No stridor. No wheezing, rhonchi or rales.  Skin:    General: Skin is warm and dry.     Findings: No rash.  Neurological:     Mental Status: She is alert and oriented to person, place, and time.  Psychiatric:        Mood and Affect: Mood normal.        Behavior: Behavior normal.        Thought Content: Thought content normal.    DG Chest 2 View  Result Date: 04/28/2021 CLINICAL DATA:  Cough EXAM: CHEST - 2 VIEW COMPARISON:  Chest x-ray dated January 27th 2020 6 spot FINDINGS: The heart size and mediastinal contours are within normal limits. Both lungs are clear. The visualized skeletal structures are unremarkable. IMPRESSION: No active cardiopulmonary disease. Electronically Signed   By: Allegra Lai M.D.   On: 04/28/2021 11:29     Assessment and Plan :   PDMP  not reviewed this encounter.  1. Persistent cough   2. Mild intermittent asthma without complication    We will hold off on more antibiotics or steroids.  Recommended restarting her albuterol inhaler.  Use supportive care otherwise.  Maintain the sinus medication she uses including antihistamine and decongestant. Counseled patient on potential for adverse effects with medications prescribed/recommended today, ER and return-to-clinic precautions discussed, patient verbalized understanding.    Wallis Bamberg, PA-C 04/28/21 1145

## 2021-04-28 NOTE — ED Triage Notes (Signed)
Cough, fever, body aches since November 2nd finished antibiotics last week and a round of prednisone.  States symptoms have returned.

## 2021-05-30 ENCOUNTER — Encounter: Payer: Self-pay | Admitting: Emergency Medicine

## 2021-05-30 ENCOUNTER — Other Ambulatory Visit: Payer: Self-pay

## 2021-05-30 ENCOUNTER — Ambulatory Visit
Admission: EM | Admit: 2021-05-30 | Discharge: 2021-05-30 | Disposition: A | Discharge status: Home / Self Care | Payer: 59 | Attending: Urgent Care | Admitting: Urgent Care

## 2021-05-30 DIAGNOSIS — Z8616 Personal history of COVID-19: Secondary | ICD-10-CM | POA: Diagnosis not present

## 2021-05-30 DIAGNOSIS — J3089 Other allergic rhinitis: Secondary | ICD-10-CM

## 2021-05-30 DIAGNOSIS — J018 Other acute sinusitis: Secondary | ICD-10-CM

## 2021-05-30 MED ORDER — AMOXICILLIN 875 MG PO TABS: 875.0000 mg | ORAL_TABLET | Freq: Two times a day (BID) | ORAL | 0 refills | Status: DC

## 2021-05-30 MED ORDER — PROMETHAZINE-DM 6.25-15 MG/5ML PO SYRP: 5.0000 mL | ORAL_SOLUTION | Freq: Every evening | ORAL | 0 refills | Status: DC | PRN

## 2021-05-30 MED ORDER — PREDNISONE 20 MG PO TABS: ORAL_TABLET | ORAL | 0 refills | Status: DC

## 2021-05-30 NOTE — ED Triage Notes (Signed)
Chills, scratchy throat nasal congestion, body aches since yesterday.  Covid positive in December.

## 2021-05-30 NOTE — ED Provider Notes (Signed)
[facility]   MRN: [id number] DOB: 03/13/1976  Subjective:   Lori Duncan is a 46 y.o. female presenting for 1 month history of persistent sinus congestion, postnasal drainage, coughing.  Patient reports that she tested positive for COVID-19 in December.  She was last seen then, had a negative chest x-ray.  Was offered supportive care.  She does have a history of asthma and allergic rhinitis.  She has been taking her allergy medication consistently throughout December and this month.  No facial pain, chest pain, shortness of breath or wheezing.  No current facility-administered medications for this encounter.  Current Outpatient Medications:    albuterol (VENTOLIN HFA) 108 (90 Base) MCG/ACT inhaler, Inhale 2 puffs into the lungs every 4 (four) hours as needed for wheezing or shortness of breath., Disp: 18 g, Rfl: 0   benzonatate (TESSALON) 100 MG capsule, Take 1-2 capsules (100-200 mg total) by mouth 3 (three) times daily as needed for cough., Disp: 60 capsule, Rfl: 0   ibuprofen (ADVIL,MOTRIN) 200 MG tablet, Take 400-600 mg by mouth every 6 (six) hours as needed. For pain, Disp: , Rfl:    phenazopyridine (PYRIDIUM) 200 MG tablet, Take 1 tablet (200 mg total) by mouth 3 (three) times daily., Disp: 6 tablet, Rfl: 0   promethazine-dextromethorphan (PROMETHAZINE-DM) 6.25-15 MG/5ML syrup, Take 5 mLs by mouth at bedtime as needed for cough., Disp: 100 mL, Rfl: 0   No Known Allergies  Past Medical History:  Diagnosis Date   Asthma    Chlamydia 10/2009   LGSIL (low grade squamous intraepithelial dysplasia) 11/2010   Migraines    Smoker    STD (sexually transmitted disease)    Chlamydia     Past Surgical History:  Procedure Laterality Date   CERVICAL BIOPSY  W/ LOOP ELECTRODE EXCISION     COLPOSCOPY  11/2010   INTRAUTERINE DEVICE INSERTION  12/2009   LEEP  2002   SHOULDER SURGERY Right    TONSILLECTOMY AND ADENOIDECTOMY      Family History  Problem  Relation Age of Onset   Diabetes Mother    Hypertension Mother    Thyroid disease Mother    Diabetes Father    Diabetes Paternal Grandmother    Hypertension Paternal Grandmother     Social History   Tobacco Use   Smoking status: Former    Packs/day: 0.50    Types: Cigarettes   Smokeless tobacco: Never  Vaping Use   Vaping Use: Never used  Substance Use Topics   Alcohol use: Yes    Comment: occassionally   Drug use: No    Types: Codeine    ROS   Objective:   Vitals: BP 136/85 (BP Location: Right Arm)    Pulse 92    Temp 97.9 F (36.6 C) (Oral)    Resp 18    LMP 05/20/2021    SpO2 96%   Physical Exam Constitutional:      General: She is not in acute distress.    Appearance: Normal appearance. She is well-developed and normal weight. She is not ill-appearing, toxic-appearing or diaphoretic.  HENT:     Head: Normocephalic and atraumatic.     Right Ear: Tympanic membrane, ear canal and external ear normal. No drainage or tenderness. No middle ear effusion. There is no impacted cerumen. Tympanic membrane is not erythematous.     Left Ear: Tympanic membrane, ear canal and external ear normal. No drainage or tenderness.  No middle ear effusion. There is no impacted cerumen. Tympanic membrane  is not erythematous.     Nose: Congestion present. No rhinorrhea.     Comments: Nasal mucosa boggy and edematous.    Mouth/Throat:     Mouth: Mucous membranes are moist. No oral lesions.     Pharynx: No pharyngeal swelling, oropharyngeal exudate, posterior oropharyngeal erythema or uvula swelling.     Tonsils: No tonsillar exudate or tonsillar abscesses.  Eyes:     General: No scleral icterus.       Right eye: No discharge.        Left eye: No discharge.     Extraocular Movements: Extraocular movements intact.     Right eye: Normal extraocular motion.     Left eye: Normal extraocular motion.     Conjunctiva/sclera: Conjunctivae normal.  Cardiovascular:     Rate and Rhythm: Normal  rate and regular rhythm.     Pulses: Normal pulses.     Heart sounds: Normal heart sounds. No murmur heard.   No friction rub. No gallop.  Pulmonary:     Effort: Pulmonary effort is normal. No respiratory distress.     Breath sounds: Normal breath sounds. No stridor. No wheezing, rhonchi or rales.  Musculoskeletal:     Cervical back: Normal range of motion and neck supple.  Lymphadenopathy:     Cervical: No cervical adenopathy.  Skin:    General: Skin is warm and dry.     Findings: No rash.  Neurological:     General: No focal deficit present.     Mental Status: She is alert and oriented to person, place, and time.  Psychiatric:        Mood and Affect: Mood normal.        Behavior: Behavior normal.        Thought Content: Thought content normal.    Assessment and Plan :   PDMP not reviewed this encounter.  1. Acute non-recurrent sinusitis of other sinus   2. Allergic rhinitis due to other allergic trigger, unspecified seasonality   3. History of COVID-19    Offered patient an oral prednisone course in the context of her allergic rhinitis, asthma.  In the event that she has no improvement in the next 3 days, recommend that she fill the prescription for amoxicillin to address a bacterial sinusitis.  Will defer repeat chest x-ray given a clear cardiopulmonary exam, hemodynamically stable vital signs.  Use supportive care otherwise. Counseled patient on potential for adverse effects with medications prescribed/recommended today, ER and return-to-clinic precautions discussed, patient verbalized understanding.    Wallis Bamberg, PA-C 05/30/21 1211

## 2022-02-16 ENCOUNTER — Ambulatory Visit
Admission: EM | Admit: 2022-02-16 | Discharge: 2022-02-16 | Disposition: A | Discharge status: Home / Self Care | Payer: BC Managed Care – PPO | Attending: Emergency Medicine | Admitting: Emergency Medicine

## 2022-02-16 DIAGNOSIS — L03012 Cellulitis of left finger: Secondary | ICD-10-CM | POA: Insufficient documentation

## 2022-02-16 MED ORDER — IBUPROFEN 600 MG PO TABS: 600.0000 mg | ORAL_TABLET | Freq: Four times a day (QID) | ORAL | 0 refills | Status: AC | PRN

## 2022-02-16 MED ORDER — DOXYCYCLINE HYCLATE 100 MG PO CAPS: 100.0000 mg | ORAL_CAPSULE | Freq: Two times a day (BID) | ORAL | 0 refills | Status: DC

## 2022-02-16 MED ORDER — CHLORHEXIDINE GLUCONATE 4 % EX LIQD: Freq: Every day | CUTANEOUS | 0 refills | Status: DC | PRN

## 2022-02-16 NOTE — ED Provider Notes (Signed)
HPI  SUBJECTIVE:  Lori Duncan is a right-handed 46 y.o. female who presents with 5 days of left index finger pain, swelling, erythema by the radial nail fold after biting off a hangnail.  She reports throbbing constant pain that is worse with movement, palpation.  She reports some fingerpad swelling.  No finger swelling.  No fevers.  She tried Aleve ice, warm compresses without improvement in her symptoms.  Symptoms are better with holding her finger bent, worse with full extension, palpation, movement.  Last dose of Aleve was 1 hour prior to arrival.  She has a past medical history of MRSA.  LMP: 2 weeks ago.  Denies the possibility of being pregnant.  PCP: [facility]   Past Medical History:  Diagnosis Date   Asthma    Chlamydia 10/2009   LGSIL (low grade squamous intraepithelial dysplasia) 11/2010   Migraines    Smoker    STD (sexually transmitted disease)    Chlamydia    Past Surgical History:  Procedure Laterality Date   CERVICAL BIOPSY  W/ LOOP ELECTRODE EXCISION     COLPOSCOPY  11/2010   INTRAUTERINE DEVICE INSERTION  12/2009   LEEP  2002   SHOULDER SURGERY Right    TONSILLECTOMY AND ADENOIDECTOMY      Family History  Problem Relation Age of Onset   Diabetes Mother    Hypertension Mother    Thyroid disease Mother    Diabetes Father    Diabetes Paternal Grandmother    Hypertension Paternal Grandmother     Social History   Tobacco Use   Smoking status: Former    Packs/day: 0.50    Types: Cigarettes   Smokeless tobacco: Never  Vaping Use   Vaping Use: Never used  Substance Use Topics   Alcohol use: Yes    Comment: occassionally   Drug use: No    Types: Codeine    No current facility-administered medications for this encounter.  Current Outpatient Medications:    chlorhexidine (HIBICLENS) 4 % external liquid, Apply topically daily as needed. Dilute 10-15 mL in water, Use daily when bathing for 1-2 weeks, Disp: 120 mL, Rfl: 0    doxycycline (VIBRAMYCIN) 100 MG capsule, Take 1 capsule (100 mg total) by mouth 2 (two) times daily for 5 days., Disp: 10 capsule, Rfl: 0   ibuprofen (ADVIL) 600 MG tablet, Take 1 tablet (600 mg total) by mouth every 6 (six) hours as needed., Disp: 30 tablet, Rfl: 0   levocetirizine (XYZAL) 5 MG tablet, Take 5 mg by mouth every evening., Disp: , Rfl:    levonorgestrel (MIRENA) 20 MCG/DAY IUD, 1 each by Intrauterine route once., Disp: , Rfl:    albuterol (VENTOLIN HFA) 108 (90 Base) MCG/ACT inhaler, Inhale 2 puffs into the lungs every 4 (four) hours as needed for wheezing or shortness of breath., Disp: 18 g, Rfl: 0  No Known Allergies   ROS  As noted in HPI.   Physical Exam  BP 113/78 (BP Location: Right Arm)   Pulse 68   Temp 98.2 F (36.8 C) (Oral)   Resp 18   SpO2 98%   Constitutional: Well developed, well nourished, no acute distress Eyes:  EOMI, conjunctiva normal bilaterally HENT: Normocephalic, atraumatic,mucus membranes moist Respiratory: Normal inspiratory effort Cardiovascular: Normal rate GI: nondistended skin: No rash, skin intact Musculoskeletal: Left index finger tender, erythematous, swollen, especially along the radial nail fold.  Positive mild tender swelling along the finger pad.  Sensation distally grossly intact.  Cap refill less  than 2 seconds.  No tenderness over the flexor tendons.      Neurologic: Alert & oriented x 3, no focal neuro deficits Psychiatric: Speech and behavior appropriate   ED Course   Medications - No data to display  Orders Placed This Encounter  Procedures   Aerobic Culture w Gram Stain (superficial specimen)    Standing Status:   Standing    Number of Occurrences:   1    No results found for this or any previous visit (from the past 24 hour(s)). No results found.  ED Clinical Impression  1. Paronychia of left index finger      ED Assessment/Plan    Patient with a paronychia, possibly early felon.  Discussed  sending her to the ER for evaluation for a felon, but patient has opted to attempt I&D here.  If she gets significantly better after I&D, we can try doxycycline, Tylenol and ibuprofen for 5 days with the understanding that if she gets worse in the next 24 hours, she is to go to the ED for treatment of a felon.  Patient agrees with plan.  Procedure note: Had patient wash hands extensively with chlorhexidine soap and tap water.  Then cleaned the base of the finger with alcohol.  Used 1 cc total of 1% plain lidocaine for digital block with adequate anesthesia.  Used a sterile 11 blade to make a single incision along the radial nail fold expressed a small amount purulent drainage.  Sent this off for culture.  Applied bandage.  Patient tolerated procedure well.  Discussed labs,  MDM, treatment plan, and plan for follow-up with patient. Discussed sn/sx that should prompt return to the ED. patient agrees with plan.   Meds ordered this encounter  Medications   doxycycline (VIBRAMYCIN) 100 MG capsule    Sig: Take 1 capsule (100 mg total) by mouth 2 (two) times daily for 5 days.    Dispense:  10 capsule    Refill:  0   ibuprofen (ADVIL) 600 MG tablet    Sig: Take 1 tablet (600 mg total) by mouth every 6 (six) hours as needed.    Dispense:  30 tablet    Refill:  0   chlorhexidine (HIBICLENS) 4 % external liquid    Sig: Apply topically daily as needed. Dilute 10-15 mL in water, Use daily when bathing for 1-2 weeks    Dispense:  120 mL    Refill:  0      *This clinic note was created using [employer]. Therefore, there may be occasional mistakes despite careful proofreading.  ?    Melynda Ripple, MD 02/16/22 1146

## 2022-02-16 NOTE — Discharge Instructions (Addendum)
Keep this clean and dry for the first 18 and 24 hours, you can keep it clean with Hibiclens soap and water.  Finish doxycycline, even if you feel better.  Take 600 mg of ibuprofen, 1000 mg of Tylenol together 3-4 times a day as needed for pain.  Go to the ER if you are not getting better in 24 hours, or if you get worse in the interim.

## 2022-02-16 NOTE — ED Triage Notes (Signed)
Pt reports swelling and tenderness in the left index finger 2-3 days ago.

## 2022-02-18 ENCOUNTER — Telehealth (HOSPITAL_COMMUNITY): Payer: Self-pay | Admitting: Emergency Medicine

## 2022-02-18 LAB — AEROBIC CULTURE W GRAM STAIN (SUPERFICIAL SPECIMEN)

## 2022-02-18 MED ORDER — SULFAMETHOXAZOLE-TRIMETHOPRIM 800-160 MG PO TABS: 1.0000 | ORAL_TABLET | Freq: Two times a day (BID) | ORAL | 0 refills | Status: AC

## 2022-04-14 ENCOUNTER — Ambulatory Visit
Admission: RE | Admit: 2022-04-14 | Discharge: 2022-04-14 | Disposition: A | Discharge status: Home / Self Care | Payer: BC Managed Care – PPO | Source: Ambulatory Visit | Attending: Family Medicine | Admitting: Family Medicine

## 2022-04-14 ENCOUNTER — Other Ambulatory Visit: Payer: Self-pay

## 2022-04-14 VITALS — BP 115/78 | HR 71 | Temp 97.9°F | Resp 18

## 2022-04-14 DIAGNOSIS — J45909 Unspecified asthma, uncomplicated: Secondary | ICD-10-CM | POA: Insufficient documentation

## 2022-04-14 DIAGNOSIS — Z79899 Other long term (current) drug therapy: Secondary | ICD-10-CM | POA: Insufficient documentation

## 2022-04-14 DIAGNOSIS — Z87891 Personal history of nicotine dependence: Secondary | ICD-10-CM | POA: Insufficient documentation

## 2022-04-14 DIAGNOSIS — J069 Acute upper respiratory infection, unspecified: Secondary | ICD-10-CM | POA: Diagnosis not present

## 2022-04-14 DIAGNOSIS — Z1152 Encounter for screening for COVID-19: Secondary | ICD-10-CM | POA: Insufficient documentation

## 2022-04-14 DIAGNOSIS — Z7951 Long term (current) use of inhaled steroids: Secondary | ICD-10-CM | POA: Diagnosis not present

## 2022-04-14 DIAGNOSIS — R058 Other specified cough: Secondary | ICD-10-CM | POA: Diagnosis not present

## 2022-04-14 LAB — RESP PANEL BY RT-PCR (RSV, FLU A&B, COVID)  RVPGX2
Influenza A by PCR: NEGATIVE
Influenza B by PCR: NEGATIVE
Resp Syncytial Virus by PCR: NEGATIVE
SARS Coronavirus 2 by RT PCR: NEGATIVE

## 2022-04-14 MED ORDER — PROMETHAZINE-DM 6.25-15 MG/5ML PO SYRP: 5.0000 mL | ORAL_SOLUTION | Freq: Four times a day (QID) | ORAL | 0 refills | Status: AC | PRN

## 2022-04-14 NOTE — ED Triage Notes (Signed)
Pt reports sore throat,HA, general body aches. Pt has tried OTC tylenol and cold meds with out relief

## 2022-04-14 NOTE — ED Provider Notes (Signed)
[facility]    CSN: [id number] Arrival date & time: 04/14/22  1640      History   Chief Complaint Chief Complaint  Patient presents with   Headache    I think I have a sinus infection or flu. - Entered by patient   Sore Throat   Generalized Body Aches    HPI Lori Duncan is a 46 y.o. female.   Pt reports sore throat,HA, general body aches. Pt has tried OTC tylenol and cold meds with out relief      Past Medical History:  Diagnosis Date   Asthma    Chlamydia 10/2009   LGSIL (low grade squamous intraepithelial dysplasia) 11/2010   Migraines    Smoker    STD (sexually transmitted disease)    Chlamydia    Patient Active Problem List   Diagnosis Date Noted   Smoker    Dysplasia of cervix     Past Surgical History:  Procedure Laterality Date   CERVICAL BIOPSY  W/ LOOP ELECTRODE EXCISION     COLPOSCOPY  11/2010   INTRAUTERINE DEVICE INSERTION  12/2009   LEEP  2002   SHOULDER SURGERY Right    TONSILLECTOMY AND ADENOIDECTOMY      OB History     Gravida  1   Para  1   Term  1   Preterm      AB      Living  1      SAB      IAB      Ectopic      Multiple      Live Births               Home Medications    Prior to Admission medications   Medication Sig Start Date End Date Taking? Authorizing Provider  promethazine-dextromethorphan (PROMETHAZINE-DM) 6.25-15 MG/5ML syrup Take 5 mLs by mouth 4 (four) times daily as needed. 04/14/22  Yes Particia Nearing, PA-C  albuterol (VENTOLIN HFA) 108 (90 Base) MCG/ACT inhaler Inhale 2 puffs into the lungs every 4 (four) hours as needed for wheezing or shortness of breath. 04/28/21   Wallis Bamberg, PA-C  chlorhexidine (HIBICLENS) 4 % external liquid Apply topically daily as needed. Dilute 10-15 mL in water, Use daily when bathing for 1-2 weeks 02/16/22   Domenick Gong, MD  ibuprofen (ADVIL) 600 MG tablet Take 1 tablet (600 mg total) by mouth every 6 (six) hours as needed.  02/16/22   Domenick Gong, MD  levocetirizine (XYZAL) 5 MG tablet Take 5 mg by mouth every evening.    [provider]  levonorgestrel (MIRENA) 20 MCG/DAY IUD 1 each by Intrauterine route once.    [provider]  eletriptan (RELPAX) 40 MG tablet Take 1 tablet by mouth 2 (two) times daily as needed.  08/28/20  [provider]  metoCLOPramide (REGLAN) 10 MG tablet Take 1 tablet (10 mg total) by mouth every 6 (six) hours as needed for nausea. 10/20/18 08/28/20  Caccavale, Sophia, PA-C  montelukast (SINGULAIR) 10 MG tablet Take 1 tablet by mouth daily.  08/28/20  [provider]    Family History Family History  Problem Relation Age of Onset   Diabetes Mother    Hypertension Mother    Thyroid disease Mother    Diabetes Father    Diabetes Paternal Grandmother    Hypertension Paternal Grandmother     Social History Social History   Tobacco Use   Smoking status: Former    Packs/day:  0.50    Types: Cigarettes   Smokeless tobacco: Never  Vaping Use   Vaping Use: Never used  Substance Use Topics   Alcohol use: Yes    Comment: occassionally   Drug use: No    Types: Codeine     Allergies   Patient has no known allergies.   Review of Systems Review of Systems PER HPI  Physical Exam Triage Vital Signs ED Triage Vitals  Enc Vitals Group     BP 04/14/22 1706 115/78     Pulse Rate 04/14/22 1706 71     Resp 04/14/22 1706 18     Temp 04/14/22 1706 97.9 F (36.6 C)     Temp src --      SpO2 04/14/22 1706 97 %     Weight --      Height --      Head Circumference --      Peak Flow --      Pain Score 04/14/22 1709 4     Pain Loc --      Pain Edu? --      Excl. in GC? --    No data found.  Updated Vital Signs BP 115/78   Pulse 71   Temp 97.9 F (36.6 C)   Resp 18   LMP 04/14/2022   SpO2 97%   Visual Acuity Right Eye Distance:   Left Eye Distance:   Bilateral Distance:    Right Eye Near:   Left Eye Near:    Bilateral Near:      Physical Exam Vitals and nursing note reviewed.  Constitutional:      Appearance: Normal appearance.  HENT:     Head: Atraumatic.     Right Ear: Tympanic membrane and external ear normal.     Left Ear: Tympanic membrane and external ear normal.     Nose: Congestion present.     Mouth/Throat:     Mouth: Mucous membranes are moist.     Pharynx: Posterior oropharyngeal erythema present.  Eyes:     Extraocular Movements: Extraocular movements intact.     Conjunctiva/sclera: Conjunctivae normal.  Cardiovascular:     Rate and Rhythm: Normal rate and regular rhythm.     Heart sounds: Normal heart sounds.  Pulmonary:     Effort: Pulmonary effort is normal.     Breath sounds: Normal breath sounds. No wheezing or rales.  Musculoskeletal:        General: Normal range of motion.     Cervical back: Normal range of motion and neck supple.  Skin:    General: Skin is warm and dry.  Neurological:     Mental Status: She is alert and oriented to person, place, and time.  Psychiatric:        Mood and Affect: Mood normal.        Thought Content: Thought content normal.      UC Treatments / Results  Labs (all labs ordered are listed, but only abnormal results are displayed) Labs Reviewed - No data to display  EKG   Radiology No results found.  Procedures Procedures (including critical care time)  Medications Ordered in UC Medications - No data to display  Initial Impression / Assessment and Plan / UC Course  I have reviewed the triage vital signs and the nursing notes.  Pertinent labs & imaging results that were available during my care of the patient were reviewed by me and considered in my medical decision making (see chart for details).  Vitals and exam reassuring and suggestive of a viral upper respiratory infection.  Respiratory panel pending, treat with Phenergan DM, supportive over-the-counter medications and home care.  Return for worsening symptoms.  Final  Clinical Impressions(s) / UC Diagnoses   Final diagnoses:  Viral URI with cough   Discharge Instructions   None    ED Prescriptions     Medication Sig Dispense Auth. Provider   promethazine-dextromethorphan (PROMETHAZINE-DM) 6.25-15 MG/5ML syrup Take 5 mLs by mouth 4 (four) times daily as needed. 100 mL Particia Nearing, [place]      PDMP not reviewed this encounter.   Particia Nearing, [place] 04/14/22 1731

## 2022-04-16 DIAGNOSIS — J22 Unspecified acute lower respiratory infection: Secondary | ICD-10-CM | POA: Diagnosis not present

## 2022-04-16 DIAGNOSIS — J069 Acute upper respiratory infection, unspecified: Secondary | ICD-10-CM | POA: Diagnosis not present

## 2022-04-16 DIAGNOSIS — E6609 Other obesity due to excess calories: Secondary | ICD-10-CM | POA: Diagnosis not present

## 2022-04-16 DIAGNOSIS — Z6832 Body mass index (BMI) 32.0-32.9, adult: Secondary | ICD-10-CM | POA: Diagnosis not present

## 2022-05-15 DIAGNOSIS — Z0001 Encounter for general adult medical examination with abnormal findings: Secondary | ICD-10-CM | POA: Diagnosis not present

## 2022-09-30 ENCOUNTER — Ambulatory Visit (HOSPITAL_COMMUNITY): Payer: No Typology Code available for payment source | Attending: Orthopaedic Surgery | Admitting: Occupational Therapy

## 2022-09-30 ENCOUNTER — Encounter (HOSPITAL_COMMUNITY): Payer: Self-pay | Admitting: Occupational Therapy

## 2022-09-30 ENCOUNTER — Other Ambulatory Visit: Payer: Self-pay

## 2022-09-30 DIAGNOSIS — R29898 Other symptoms and signs involving the musculoskeletal system: Secondary | ICD-10-CM | POA: Insufficient documentation

## 2022-09-30 DIAGNOSIS — M25512 Pain in left shoulder: Secondary | ICD-10-CM | POA: Diagnosis present

## 2022-09-30 DIAGNOSIS — M25612 Stiffness of left shoulder, not elsewhere classified: Secondary | ICD-10-CM | POA: Diagnosis present

## 2022-09-30 NOTE — Patient Instructions (Signed)
TOWEL SLIDES COMPLETE FOR 1-3 MINUTES, 3-5 TIMES PER DAY  SHOULDER: Flexion On Table   Place hands on table, elbows straight. Move hips away from body. Press hands down into table. 3-5 reps per set, 3-5 sets per day  Abduction (Passive)   With arm out to side, resting on table, lower head toward arm, keeping trunk away from table.  Repeat 10 times. Do 3-5 sessions per day.  Copyright  VHI. All rights reserved.     Internal Rotation (Assistive)   Seated with elbow bent at right angle and held against side, slide arm on table surface in an inward arc. Repeat 10 times. Do 3-5 sessions per day. Activity: Use this motion to brush crumbs off the table.  Copyright  VHI. All rights reserved.    COMPLETE PENDULUM EXERCISES FOR 30 SECONDS TO A MINUTE EACH, 3-5 TIMES PER DAY. ROM: Pendulum (Side-to-Side)   Bend forward 90 at waist, using table for support. Rock body side to side to swing arm. Repeat for 1 minute. Do 3-5 sessions per day.    Copyright  VHI. All rights reserved.  Pendulum Forward/Back   Bend forward 90 at waist, using table for support. Rock body forward and back to swing arm. Repeat for 1 minute. Do 3-5 sessions per day.  Copyright  VHI. All rights reserved.  Pendulum Circular   Bend forward 90 at waist, leaning on table for support. Rock body in a circular pattern to move arm clockwise for 1 minute then counterclockwise for 1'  Do 3-5 sessions per day.  Copyright  VHI. All rights reserved.  AROM: Wrist Extension   With right palm down, bend wrist up. Repeat 10____ times per set. Do __3-5__ sessions per day.  Copyright  VHI. All rights reserved.   AROM: Wrist Flexion   With right palm up, bend wrist up. Repeat ___10_ times per set. Do __3-5__ sessions per day.  Copyright  VHI. All rights reserved.   AROM: Forearm Pronation / Supination   With right arm in handshake position, slowly rotate palm down until stretch is felt. Relax. Then  rotate palm up until stretch is felt. Repeat __10__ times per set. Do __3-5__ sessions per day.  Copyright  VHI. All rights reserved.   AFlexion (Passive)   Use other hand to bend elbow, with thumb toward same shoulder. Do NOT force this motion. Repeat 10 times. Do 3-5 sessions per day.

## 2022-09-30 NOTE — Therapy (Signed)
OUTPATIENT OCCUPATIONAL THERAPY ORTHO EVALUATION  Patient Name: Kinga Flathers MRN: [id number] DOB:06-06-1975, 47 y.o., female Today's Date: 09/30/2022  PCP: Dr. Assunta Found REFERRING PROVIDER: Dr. Ramond Marrow  END OF SESSION:  OT End of Session - 09/30/22 0940     Visit Number 1    Number of Visits 16    Date for OT Re-Evaluation 11/29/22   mini-reassessment 10/29/22   Authorization Type [employer]    Authorization Time Period 60 visit limit    Authorization - Visit Number 1    Authorization - Number of Visits 60    OT Start Time 0900    OT Stop Time 0934    OT Time Calculation (min) 34 min    Activity Tolerance Patient tolerated treatment well    Behavior During Therapy [facility] for tasks assessed/performed             Past Medical History:  Diagnosis Date   Asthma    Chlamydia 10/2009   LGSIL (low grade squamous intraepithelial dysplasia) 11/2010   Migraines    Smoker    STD (sexually transmitted disease)    Chlamydia   Past Surgical History:  Procedure Laterality Date   CERVICAL BIOPSY  W/ LOOP ELECTRODE EXCISION     COLPOSCOPY  11/2010   INTRAUTERINE DEVICE INSERTION  12/2009   LEEP  2002   SHOULDER SURGERY Right    TONSILLECTOMY AND ADENOIDECTOMY     Patient Active Problem List   Diagnosis Date Noted   Smoker    Dysplasia of cervix     ONSET DATE: 09/24/22  REFERRING DIAG: LEFT SHOULDER ARTHROSCOPY WITH SUBACROMIAL DECOMPRESSION,DISTAL CLAVICLE EXCISION, BICEPS TENODESIS   THERAPY DIAG:  Acute pain of left shoulder  Other symptoms and signs involving the musculoskeletal system  Stiffness of left shoulder, not elsewhere classified  Rationale for Evaluation and Treatment: Rehabilitation  SUBJECTIVE:   SUBJECTIVE STATEMENT: S: I haven't felt very tight.  Pt accompanied by: self  PERTINENT HISTORY: Pt is a 47 y/o female s/p left shoulder arthroscopy with subacromial decompression, distal clavicle excision, and bicep tenodesis on 09/24/22. Pt  presents in Don Joy abduction sling, reports she has been moving her hand and wrist, has not taken sling off except for bathing/dressing.   PRECAUTIONS: Shoulder See protocol.   WEIGHT BEARING RESTRICTIONS: Yes NWB  PAIN:  Are you having pain? Yes: NPRS scale: 1/10 Pain location: aching Pain description: left shoulder Aggravating factors: movement Relieving factors: ice  FALLS: Has patient fallen in last 6 months? No  PLOF: Independent  PATIENT GOALS: To be able to use the LUE  NEXT MD VISIT: 10/02/22  OBJECTIVE:   HAND DOMINANCE: Right  ADLs: Overall ADLs: Pt is unable to use the LUE for any ADLs, is sleeping in the recliner. 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    FUNCTIONAL OUTCOME MEASURES: FOTO: 13/100   UPPER EXTREMITY ROM:       Assessed supine, er/IR adducted  Passive ROM Left eval  Shoulder flexion 124  Shoulder abduction 141  Shoulder internal rotation 90  Shoulder external rotation 52  (Blank rows = not tested)   UPPER EXTREMITY MMT:       Not assessed due to precautions and pain  MMT Left eval  Shoulder flexion   Shoulder abduction   Shoulder internal rotation   Shoulder external rotation   (Blank rows = not tested)  SENSATION: Occasional tingling/numbness in hand/fingers  COGNITION: Overall cognitive status:  Within functional limits for tasks assessed  OBSERVATIONS: Min/mod fascial restrictions along anterior shoulder, trapezius, and scapular regions   TODAY'S TREATMENT:                                                                                                                              DATE: N/A-eval only     PATIENT EDUCATION: Education details: pendulums, table slides, wrist/forearm A/ROM, elbow AA/ROM Person educated: Patient Education method: Explanation, Demonstration, and Handouts Education comprehension: verbalized understanding and returned demonstration  HOME EXERCISE  PROGRAM: Eval: pendulums, table slides, wrist/forearm A/ROM, elbow AA/ROM  GOALS: Goals reviewed with patient? Yes  SHORT TERM GOALS: Target date: 10/30/22  Pt will be provided with and educated on HEP to improve mobility in LUE required for use during ADL completion.   Goal status: INITIAL  2.  Pt will increase LUE P/ROM by 30 degrees or greater to improve ability to use LUE during dressing tasks with minimal compensatory techniques.   Goal status: INITIAL  3.  Pt will increase LUE strength to 3+/5 to improve ability to reach for items at waist to chest height during bathing and grooming tasks.   Goal status: INITIAL    LONG TERM GOALS: Target date: 11/30/22  Pt will decrease pain in LUE to 3/10 or less to improve ability to sleep for 2+ consecutive hours without waking due to pain.   Goal status: INITIAL  2.  Pt will decrease LUE fascial restrictions to trace amounts or less to improve mobility required for functional reaching tasks.   Goal status: INITIAL  3.  Pt will increase LUE A/ROM to 145 degrees or greater for flexion and abduction, and 50 degrees or greater for er, to improve ability to use LUE when reaching overhead or behind back during dressing and bathing tasks.   Goal status: INITIAL  4.  Pt will increase LUE strength to 4+/5 or greater to improve ability to use LUE when lifting or carrying items during meal preparation/housework/yardwork tasks.   Goal status: INITIAL  5.  Pt will return to highest level of function using LUE as non-dominant during functional task completion.   Goal status: INITIAL  ASSESSMENT:  CLINICAL IMPRESSION: Patient is a 47 y.o. female who was seen today for occupational therapy evaluation s/p left shoulder arthroscopy with subacromial decompression, distal clavicle excision, and bicep tenodesis on 09/24/22. Pt presents with decreased ROM and strength, increased pain and fasical restrictions, and significantly limited functional use  of LUE during ADL tasks.    PERFORMANCE DEFICITS: in functional skills including ADLs, IADLs, ROM, strength, pain, fascial restrictions, and UE functional use  IMPAIRMENTS: are limiting patient from ADLs, IADLs, rest and sleep, work, and leisure.   COMORBIDITIES: has no other co-morbidities that affects occupational performance. Patient will benefit from skilled OT to address above impairments and improve overall function.  MODIFICATION OR ASSISTANCE TO COMPLETE EVALUATION: No modification of tasks or assist necessary to complete an evaluation.  OT OCCUPATIONAL PROFILE AND HISTORY: Problem focused assessment: Including review of records relating to presenting problem.  CLINICAL DECISION MAKING: LOW - limited treatment options, no task modification necessary  REHAB POTENTIAL: Good  EVALUATION COMPLEXITY: Low      PLAN:  OT FREQUENCY: 2x/week  OT DURATION: 8 weeks  PLANNED INTERVENTIONS: self care/ADL training, therapeutic exercise, therapeutic activity, manual therapy, scar mobilization, passive range of motion, splinting, electrical stimulation, ultrasound, patient/family education, and DME and/or AE instructions  RECOMMENDED OTHER SERVICES: None  CONSULTED AND AGREED WITH PLAN OF CARE: Patient  PLAN FOR NEXT SESSION: Follow up on HEP, initiate manual techniques, passive stretching, follow phase I of protocol   Ezra Sites, OTR/L  [phone] 09/30/2022, 9:42 AM

## 2022-10-03 ENCOUNTER — Ambulatory Visit (HOSPITAL_COMMUNITY): Payer: No Typology Code available for payment source | Admitting: Occupational Therapy

## 2022-10-03 ENCOUNTER — Encounter (HOSPITAL_COMMUNITY): Payer: Self-pay | Admitting: Occupational Therapy

## 2022-10-03 DIAGNOSIS — R29898 Other symptoms and signs involving the musculoskeletal system: Secondary | ICD-10-CM

## 2022-10-03 DIAGNOSIS — M25512 Pain in left shoulder: Secondary | ICD-10-CM

## 2022-10-03 DIAGNOSIS — M25612 Stiffness of left shoulder, not elsewhere classified: Secondary | ICD-10-CM

## 2022-10-03 NOTE — Therapy (Signed)
OUTPATIENT OCCUPATIONAL THERAPY ORTHO TREATMENT SESSION   Patient Name: Lori Duncan MRN: [id number] DOB:10-27-1975, 47 y.o., female Today's Date: 10/03/2022  PCP: Dr. Assunta Found REFERRING PROVIDER: Dr. Ramond Marrow  END OF SESSION:  OT End of Session - 10/03/22 1114     Visit Number 2    Number of Visits 16    Date for OT Re-Evaluation 11/29/22    Authorization Type [employer]et[employer]    Authorization Time Period 60 visit limit    Authorization - Visit Number 2    Authorization - Number of Visits 60    OT Start Time 1030    OT Stop Time 1110    OT Time Calculation (min) 40 min    Activity Tolerance Patient tolerated treatment well    Behavior During Therapy [facility] for tasks assessed/performed              Past Medical History:  Diagnosis Date   Asthma    Chlamydia 10/2009   LGSIL (low grade squamous intraepithelial dysplasia) 11/2010   Migraines    Smoker    STD (sexually transmitted disease)    Chlamydia   Past Surgical History:  Procedure Laterality Date   CERVICAL BIOPSY  W/ LOOP ELECTRODE EXCISION     COLPOSCOPY  11/2010   INTRAUTERINE DEVICE INSERTION  12/2009   LEEP  2002   SHOULDER SURGERY Right    TONSILLECTOMY AND ADENOIDECTOMY     Patient Active Problem List   Diagnosis Date Noted   Smoker    Dysplasia of cervix     ONSET DATE: 09/24/22  REFERRING DIAG: LEFT SHOULDER ARTHROSCOPY WITH SUBACROMIAL DECOMPRESSION,DISTAL CLAVICLE EXCISION, BICEPS TENODESIS   THERAPY DIAG:  Acute pain of left shoulder  Other symptoms and signs involving the musculoskeletal system  Stiffness of left shoulder, not elsewhere classified  Rationale for Evaluation and Treatment: Rehabilitation  SUBJECTIVE:   SUBJECTIVE STATEMENT: S: I have been doing the table slides and arms swings with no pain.  Pt accompanied by: self  PERTINENT HISTORY: Pt is a 46 y/o female s/p left shoulder arthroscopy with subacromial decompression, distal clavicle excision, and bicep tenodesis  on 09/24/22. Pt presents in Don Joy abduction sling, reports she has been moving her hand and wrist, has not taken sling off except for bathing/dressing.   PRECAUTIONS: Shoulder See protocol.   WEIGHT BEARING RESTRICTIONS: Yes NWB  PAIN:  Are you having pain? Yes: NPRS scale: 1/10 Pain location: aching Pain description: left shoulder Aggravating factors: movement Relieving factors: ice  FALLS: Has patient fallen in last 6 months? No  PLOF: Independent  PATIENT GOALS: To be able to use the LUE  NEXT MD VISIT: 10/02/22  OBJECTIVE:   HAND DOMINANCE: Right  ADLs: Overall ADLs: Pt is unable to use the LUE for any ADLs, is sleeping in the recliner. 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    FUNCTIONAL OUTCOME MEASURES: FOTO: 13/100   UPPER EXTREMITY ROM:       Assessed supine, er/IR adducted  Passive ROM Left eval  Shoulder flexion 124  Shoulder abduction 141  Shoulder internal rotation 90  Shoulder external rotation 52  (Blank rows = not tested)   UPPER EXTREMITY MMT:       Not assessed due to precautions and pain  MMT Left eval  Shoulder flexion   Shoulder abduction   Shoulder internal rotation   Shoulder external rotation   (Blank rows = not tested)  SENSATION: Occasional  tingling/numbness in hand/fingers  COGNITION: Overall cognitive status: Within functional limits for tasks assessed  OBSERVATIONS: Min/mod fascial restrictions along anterior shoulder, trapezius, and scapular regions   TODAY'S TREATMENT:                                                                                                                              DATE: N/A-eval only    10/03/22 -Manual techniques: to decrease fascial restrictions and improve ROM, min/mod restrictions to traps and upper arm  -P/ROM: shoulder flexion, abduction, internal/external rotation  -Table slides: flexion/abduction 10x (adding in further abduction  stretch) -Scapula ROM: elevation, depression, retraction 10x     PATIENT EDUCATION: Education details: pendulums, table slides, wrist/forearm A/ROM, elbow AA/ROM Person educated: Patient Education method: Programmer, multimedia, Demonstration, and Handouts Education comprehension: verbalized understanding and returned demonstration  HOME EXERCISE PROGRAM: Eval: pendulums, table slides, wrist/forearm A/ROM, elbow AA/ROM\ 5/17: cervical ROM, scapula ROM (elevation/depression)  GOALS: Goals reviewed with patient? Yes  SHORT TERM GOALS: Target date: 10/30/22  Pt will be provided with and educated on HEP to improve mobility in LUE required for use during ADL completion.   Goal status: INITIAL  2.  Pt will increase LUE P/ROM by 30 degrees or greater to improve ability to use LUE during dressing tasks with minimal compensatory techniques.   Goal status: INITIAL  3.  Pt will increase LUE strength to 3+/5 to improve ability to reach for items at waist to chest height during bathing and grooming tasks.   Goal status: INITIAL    LONG TERM GOALS: Target date: 11/30/22  Pt will decrease pain in LUE to 3/10 or less to improve ability to sleep for 2+ consecutive hours without waking due to pain.   Goal status: INITIAL  2.  Pt will decrease LUE fascial restrictions to trace amounts or less to improve mobility required for functional reaching tasks.   Goal status: INITIAL  3.  Pt will increase LUE A/ROM to 145 degrees or greater for flexion and abduction, and 50 degrees or greater for er, to improve ability to use LUE when reaching overhead or behind back during dressing and bathing tasks.   Goal status: INITIAL  4.  Pt will increase LUE strength to 4+/5 or greater to improve ability to use LUE when lifting or carrying items during meal preparation/housework/yardwork tasks.   Goal status: INITIAL  5.  Pt will return to highest level of function using LUE as non-dominant during functional task  completion.   Goal status: INITIAL  ASSESSMENT:  CLINICAL IMPRESSION: Today was pt's first OT treatment session. She presented in abduction sling. She reports minimal pain and stiffness. Pt stated that she has been completing pendulums and table slides at home. Began manual techniques and P/ROM this session. Pt was able to tolerate 80% ROM in all directions with minimal pain. Discussed continuing all previous HEP exercises adding in scapula and cervical ROM and increasing abduction during table slides.  PERFORMANCE DEFICITS: in functional skills including ADLs, IADLs, ROM, strength, pain, fascial restrictions, and UE functional use  IMPAIRMENTS: are limiting patient from ADLs, IADLs, rest and sleep, work, and leisure.   COMORBIDITIES: has no other co-morbidities that affects occupational performance. Patient will benefit from skilled OT to address above impairments and improve overall function.  MODIFICATION OR ASSISTANCE TO COMPLETE EVALUATION: No modification of tasks or assist necessary to complete an evaluation.  OT OCCUPATIONAL PROFILE AND HISTORY: Problem focused assessment: Including review of records relating to presenting problem.  CLINICAL DECISION MAKING: LOW - limited treatment options, no task modification necessary  REHAB POTENTIAL: Good  EVALUATION COMPLEXITY: Low      PLAN:  OT FREQUENCY: 2x/week  OT DURATION: 8 weeks  PLANNED INTERVENTIONS: self care/ADL training, therapeutic exercise, therapeutic activity, manual therapy, scar mobilization, passive range of motion, splinting, electrical stimulation, ultrasound, patient/family education, and DME and/or AE instructions  RECOMMENDED OTHER SERVICES: None  CONSULTED AND AGREED WITH PLAN OF CARE: Patient  PLAN FOR NEXT SESSION: Follow up on HEP, initiate manual techniques, passive stretching, follow phase I of protocol   Lurena Joiner, OTR/L  [phone] 10/03/2022, 11:15 AM

## 2022-10-07 ENCOUNTER — Encounter (HOSPITAL_COMMUNITY): Payer: Self-pay | Admitting: Occupational Therapy

## 2022-10-07 ENCOUNTER — Ambulatory Visit (HOSPITAL_COMMUNITY): Payer: No Typology Code available for payment source | Admitting: Occupational Therapy

## 2022-10-07 DIAGNOSIS — M25612 Stiffness of left shoulder, not elsewhere classified: Secondary | ICD-10-CM

## 2022-10-07 DIAGNOSIS — M25512 Pain in left shoulder: Secondary | ICD-10-CM

## 2022-10-07 DIAGNOSIS — R29898 Other symptoms and signs involving the musculoskeletal system: Secondary | ICD-10-CM

## 2022-10-07 NOTE — Therapy (Signed)
OUTPATIENT OCCUPATIONAL THERAPY ORTHO TREATMENT SESSION   Patient Name: Lori Duncan MRN: [id number] DOB:1976-02-03, 47 y.o., female Today's Date: 10/07/2022  PCP: Dr. Assunta Found REFERRING PROVIDER: Dr. Ramond Marrow  END OF SESSION:  OT End of Session - 10/07/22 0904     Visit Number 3    Number of Visits 16    Date for OT Re-Evaluation 11/29/22    Authorization Type [employer]    Authorization Time Period 60 visit limit    Authorization - Visit Number 2    Authorization - Number of Visits 60    OT Start Time 0902    OT Stop Time [phone]    OT Time Calculation (min) 40 min    Activity Tolerance Patient tolerated treatment well    Behavior During Therapy [facility] for tasks assessed/performed              Past Medical History:  Diagnosis Date   Asthma    Chlamydia 10/2009   LGSIL (low grade squamous intraepithelial dysplasia) 11/2010   Migraines    Smoker    STD (sexually transmitted disease)    Chlamydia   Past Surgical History:  Procedure Laterality Date   CERVICAL BIOPSY  W/ LOOP ELECTRODE EXCISION     COLPOSCOPY  11/2010   INTRAUTERINE DEVICE INSERTION  12/2009   LEEP  2002   SHOULDER SURGERY Right    TONSILLECTOMY AND ADENOIDECTOMY     Patient Active Problem List   Diagnosis Date Noted   Smoker    Dysplasia of cervix     ONSET DATE: 09/24/22  REFERRING DIAG: LEFT SHOULDER ARTHROSCOPY WITH SUBACROMIAL DECOMPRESSION,DISTAL CLAVICLE EXCISION, BICEPS TENODESIS   THERAPY DIAG:  Acute pain of left shoulder  Other symptoms and signs involving the musculoskeletal system  Stiffness of left shoulder, not elsewhere classified  Rationale for Evaluation and Treatment: Rehabilitation  SUBJECTIVE:   SUBJECTIVE STATEMENT: S: I've gone back to work and I can feel my traps tightening up. Pt accompanied by: self  PERTINENT HISTORY: Pt is a 47 y/o female s/p left shoulder arthroscopy with subacromial decompression, distal clavicle excision, and bicep tenodesis on  09/24/22. Pt presents in Don Joy abduction sling, reports she has been moving her hand and wrist, has not taken sling off except for bathing/dressing.   PRECAUTIONS: Shoulder See protocol.   WEIGHT BEARING RESTRICTIONS: Yes NWB  PAIN:  Are you having pain? Yes: NPRS scale: 1/10 Pain location: aching Pain description: left shoulder Aggravating factors: movement Relieving factors: ice  FALLS: Has patient fallen in last 6 months? No  PLOF: Independent  PATIENT GOALS: To be able to use the LUE  NEXT MD VISIT: 10/02/22  OBJECTIVE:   HAND DOMINANCE: Right  ADLs: Overall ADLs: Pt is unable to use the LUE for any ADLs, is sleeping in the recliner. 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    FUNCTIONAL OUTCOME MEASURES: FOTO: 13/100   UPPER EXTREMITY ROM:       Assessed supine, er/IR adducted  Passive ROM Left eval  Shoulder flexion 124  Shoulder abduction 141  Shoulder internal rotation 90  Shoulder external rotation 52  (Blank rows = not tested)   UPPER EXTREMITY MMT:       Not assessed due to precautions and pain  MMT Left eval  Shoulder flexion   Shoulder abduction   Shoulder internal rotation   Shoulder external rotation   (Blank rows = not tested)  SENSATION: Occasional tingling/numbness  in hand/fingers  COGNITION: Overall cognitive status: Within functional limits for tasks assessed  OBSERVATIONS: Min/mod fascial restrictions along anterior shoulder, trapezius, and scapular regions   TODAY'S TREATMENT:                                                                                                                              DATE:     10/07/22 -Manual Therapy: myofascial release and trigger point applied to biceps, trapezius, scapular region and axillary region in order to reduce pain and fascial restrictions to improve ROM.  -P/ROM: flexion, abduction, er/IR, x15 -Ball Rolls: flexion and abduction x15 -Thumb  tacs x60" -Low level wall wash x60" -Shoulder shrugs x10 -scap retraction x10  10/03/22 -Manual techniques: to decrease fascial restrictions and improve ROM, min/mod restrictions to traps and upper arm  -P/ROM: shoulder flexion, abduction, internal/external rotation  -Table slides: flexion/abduction 10x (adding in further abduction stretch) -Scapula ROM: elevation, depression, retraction 10x     PATIENT EDUCATION: Education details: Review HEP Person educated: Patient Education method: Programmer, multimedia, Demonstration, and Handouts Education comprehension: verbalized understanding and returned demonstration  HOME EXERCISE PROGRAM: Eval: pendulums, table slides, wrist/forearm A/ROM, elbow AA/ROM\ 5/17: cervical ROM, scapula ROM (elevation/depression)  GOALS: Goals reviewed with patient? Yes  SHORT TERM GOALS: Target date: 10/30/22  Pt will be provided with and educated on HEP to improve mobility in LUE required for use during ADL completion.   Goal status: IN PROGRESS  2.  Pt will increase LUE P/ROM by 30 degrees or greater to improve ability to use LUE during dressing tasks with minimal compensatory techniques.   Goal status: IN PROGRESS  3.  Pt will increase LUE strength to 3+/5 to improve ability to reach for items at waist to chest height during bathing and grooming tasks.   Goal status: IN PROGRESS    LONG TERM GOALS: Target date: 11/30/22  Pt will decrease pain in LUE to 3/10 or less to improve ability to sleep for 2+ consecutive hours without waking due to pain.   Goal status: IN PROGRESS  2.  Pt will decrease LUE fascial restrictions to trace amounts or less to improve mobility required for functional reaching tasks.   Goal status: IN PROGRESS  3.  Pt will increase LUE A/ROM to 145 degrees or greater for flexion and abduction, and 50 degrees or greater for er, to improve ability to use LUE when reaching overhead or behind back during dressing and bathing tasks.    Goal status: IN PROGRESS  4.  Pt will increase LUE strength to 4+/5 or greater to improve ability to use LUE when lifting or carrying items during meal preparation/housework/yardwork tasks.   Goal status: IN PROGRESS  5.  Pt will return to highest level of function using LUE as non-dominant during functional task completion.   Goal status: IN PROGRESS  ASSESSMENT:  CLINICAL IMPRESSION: Continuing to follow pt's protocol, in phase 1. She is able to complete P/ROM within provided  degrees with no difficulties and minimal pain when bringing her arm back down by her side. She worked on [employer] and proximal low level shoulder exercises this session with mild fatigue noted. Pt stated the Dr. Everardo Pacific told her if she and OT feel like she is improving well, they can move forward quicker through the protocol as needed. OT providing verbal and tactile cuing throughout session for positioning and technique.   PERFORMANCE DEFICITS: in functional skills including ADLs, IADLs, ROM, strength, pain, fascial restrictions, and UE functional use   PLAN:  OT FREQUENCY: 2x/week  OT DURATION: 8 weeks  PLANNED INTERVENTIONS: self care/ADL training, therapeutic exercise, therapeutic activity, manual therapy, scar mobilization, passive range of motion, splinting, electrical stimulation, ultrasound, patient/family education, and DME and/or AE instructions  RECOMMENDED OTHER SERVICES: None  CONSULTED AND AGREED WITH PLAN OF CARE: Patient  PLAN FOR NEXT SESSION: Follow up on HEP, initiate manual techniques, passive stretching, follow phase I of protocol   Trish Mage, OTR/L [phone] 10/07/2022, 9:05 AM

## 2022-10-09 ENCOUNTER — Ambulatory Visit (HOSPITAL_COMMUNITY): Payer: No Typology Code available for payment source | Admitting: Occupational Therapy

## 2022-10-09 ENCOUNTER — Encounter (HOSPITAL_COMMUNITY): Payer: Self-pay | Admitting: Occupational Therapy

## 2022-10-09 DIAGNOSIS — M25512 Pain in left shoulder: Secondary | ICD-10-CM

## 2022-10-09 DIAGNOSIS — M25612 Stiffness of left shoulder, not elsewhere classified: Secondary | ICD-10-CM

## 2022-10-09 DIAGNOSIS — R29898 Other symptoms and signs involving the musculoskeletal system: Secondary | ICD-10-CM

## 2022-10-09 NOTE — Therapy (Signed)
OUTPATIENT OCCUPATIONAL THERAPY ORTHO TREATMENT SESSION   Patient Name: Lori Duncan MRN: [id number] DOB:[birth year], 47 y.o., female Today's Date: 10/09/2022  PCP: Dr. Assunta Found REFERRING PROVIDER: Dr. Ramond Marrow  END OF SESSION:  OT End of Session - 10/09/22 0945     Visit Number 4    Number of Visits 16    Date for OT Re-Evaluation 11/29/22    Authorization Type [employer]    Authorization Time Period 60 visit limit    Authorization - Visit Number 3    Authorization - Number of Visits 60    OT Start Time 0900    OT Stop Time 0939    OT Time Calculation (min) 39 min    Activity Tolerance Patient tolerated treatment well    Behavior During Therapy [facility] for tasks assessed/performed            Past Medical History:  Diagnosis Date   Asthma    Chlamydia 10/2009   LGSIL (low grade squamous intraepithelial dysplasia) 11/2010   Migraines    Smoker    STD (sexually transmitted disease)    Chlamydia   Past Surgical History:  Procedure Laterality Date   CERVICAL BIOPSY  W/ LOOP ELECTRODE EXCISION     COLPOSCOPY  11/2010   INTRAUTERINE DEVICE INSERTION  12/2009   LEEP  2002   SHOULDER SURGERY Right    TONSILLECTOMY AND ADENOIDECTOMY     Patient Active Problem List   Diagnosis Date Noted   Smoker    Dysplasia of cervix     ONSET DATE: 09/24/22  REFERRING DIAG: LEFT SHOULDER ARTHROSCOPY WITH SUBACROMIAL DECOMPRESSION,DISTAL CLAVICLE EXCISION, BICEPS TENODESIS   THERAPY DIAG:  Other symptoms and signs involving the musculoskeletal system  Acute pain of left shoulder  Stiffness of left shoulder, not elsewhere classified  Rationale for Evaluation and Treatment: Rehabilitation  SUBJECTIVE:   SUBJECTIVE STATEMENT: S: I felt a lot of pressure after last session in my shoulder Pt accompanied by: self  PERTINENT HISTORY: Pt is a 47 y/o female s/p left shoulder arthroscopy with subacromial decompression, distal clavicle excision, and bicep tenodesis on 09/24/22.  Pt presents in Don Joy abduction sling, reports she has been moving her hand and wrist, has not taken sling off except for bathing/dressing.   PRECAUTIONS: Shoulder See protocol.   WEIGHT BEARING RESTRICTIONS: Yes NWB  PAIN:  Are you having pain? Yes: NPRS scale: 1/10 Pain location: aching Pain description: left shoulder Aggravating factors: movement Relieving factors: ice  FALLS: Has patient fallen in last 6 months? No  PLOF: Independent  PATIENT GOALS: To be able to use the LUE  NEXT MD VISIT: 10/02/22  OBJECTIVE:   HAND DOMINANCE: Right  ADLs: Overall ADLs: Pt is unable to use the LUE for any ADLs, is sleeping in the recliner. 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    FUNCTIONAL OUTCOME MEASURES: FOTO: 13/100   UPPER EXTREMITY ROM:       Assessed supine, er/IR adducted  Passive ROM Left eval  Shoulder flexion 124  Shoulder abduction 141  Shoulder internal rotation 90  Shoulder external rotation 52  (Blank rows = not tested)   UPPER EXTREMITY MMT:       Not assessed due to precautions and pain  MMT Left eval  Shoulder flexion   Shoulder abduction   Shoulder internal rotation   Shoulder external rotation   (Blank rows = not tested)  SENSATION: Occasional tingling/numbness in hand/fingers  COGNITION: Overall cognitive status: Within functional limits for tasks assessed  OBSERVATIONS: Min/mod fascial restrictions along anterior shoulder, trapezius, and scapular regions   TODAY'S TREATMENT:                                                                                                                              DATE:     10/09/22 -Manual Therapy: myofascial release and trigger point applied to biceps, trapezius, scapular region and axillary region in order to reduce pain and fascial restrictions to improve ROM.  -P/ROM: flexion, abduction, er/IR, x10 -Isometrics: supine, flexion, extension, abduction, er,  IR, 5x10" -Low level ball on the wall: vertical, horizontal, circles both directions, x10  -Wall Slides: flexion x5 -Wall Wash x60" -Scapula ROM: extension, retraction, rows, x10  10/07/22 -Manual Therapy: myofascial release and trigger point applied to biceps, trapezius, scapular region and axillary region in order to reduce pain and fascial restrictions to improve ROM.  -P/ROM: flexion, abduction, er/IR, x15 -Ball Rolls: flexion and abduction x15 -Thumb tacs x60" -Low level wall wash x60" -Shoulder shrugs x10 -scap retraction x10  10/03/22 -Manual techniques: to decrease fascial restrictions and improve ROM, min/mod restrictions to traps and upper arm  -P/ROM: shoulder flexion, abduction, internal/external rotation  -Table slides: flexion/abduction 10x (adding in further abduction stretch) -Scapula ROM: elevation, depression, retraction 10x     PATIENT EDUCATION: Education details: Scapular ROM Person educated: Patient Education method: Explanation, Demonstration, and Handouts Education comprehension: verbalized understanding and returned demonstration  HOME EXERCISE PROGRAM: Eval: pendulums, table slides, wrist/forearm A/ROM, elbow AA/ROM\ 5/17: cervical ROM 5/23: Scapular ROM  GOALS: Goals reviewed with patient? Yes  SHORT TERM GOALS: Target date: 10/30/22  Pt will be provided with and educated on HEP to improve mobility in LUE required for use during ADL completion.   Goal status: IN PROGRESS  2.  Pt will increase LUE P/ROM by 30 degrees or greater to improve ability to use LUE during dressing tasks with minimal compensatory techniques.   Goal status: IN PROGRESS  3.  Pt will increase LUE strength to 3+/5 to improve ability to reach for items at waist to chest height during bathing and grooming tasks.   Goal status: IN PROGRESS    LONG TERM GOALS: Target date: 11/30/22  Pt will decrease pain in LUE to 3/10 or less to improve ability to sleep for 2+ consecutive  hours without waking due to pain.   Goal status: IN PROGRESS  2.  Pt will decrease LUE fascial restrictions to trace amounts or less to improve mobility required for functional reaching tasks.   Goal status: IN PROGRESS  3.  Pt will increase LUE A/ROM to 145 degrees or greater for flexion and abduction, and 50 degrees or greater for er, to improve ability to use LUE when reaching overhead or behind back during dressing and bathing tasks.   Goal status: IN PROGRESS  4.  Pt will increase LUE strength to 4+/5 or greater to  improve ability to use LUE when lifting or carrying items during meal preparation/housework/yardwork tasks.   Goal status: IN PROGRESS  5.  Pt will return to highest level of function using LUE as non-dominant during functional task completion.   Goal status: IN PROGRESS  ASSESSMENT:  CLINICAL IMPRESSION: Pt continuing to have limited pain this session. She was able to tolerate full ROM with OT completing P/ROM, as well as initiating low level isometrics in supine. Pt demonstrating good endurance with low level proximal shoulder exercises, only reporting mild pulling sensation in her clavicle/trapezius area. OT providing verbal and visual cuing to limit shoulder hiking and promote good positioning and technique.   PERFORMANCE DEFICITS: in functional skills including ADLs, IADLs, ROM, strength, pain, fascial restrictions, and UE functional use   PLAN:  OT FREQUENCY: 2x/week  OT DURATION: 8 weeks  PLANNED INTERVENTIONS: self care/ADL training, therapeutic exercise, therapeutic activity, manual therapy, scar mobilization, passive range of motion, splinting, electrical stimulation, ultrasound, patient/family education, and DME and/or AE instructions  RECOMMENDED OTHER SERVICES: None  CONSULTED AND AGREED WITH PLAN OF CARE: Patient  PLAN FOR NEXT SESSION: Follow up on HEP, initiate manual techniques, passive stretching, follow phase I of protocol   Trish Mage,  OTR/L [phone] 10/09/2022, 9:46 AM

## 2022-10-16 ENCOUNTER — Ambulatory Visit (HOSPITAL_COMMUNITY): Payer: No Typology Code available for payment source | Admitting: Occupational Therapy

## 2022-10-16 ENCOUNTER — Encounter (HOSPITAL_COMMUNITY): Payer: Self-pay | Admitting: Occupational Therapy

## 2022-10-16 DIAGNOSIS — M25612 Stiffness of left shoulder, not elsewhere classified: Secondary | ICD-10-CM

## 2022-10-16 DIAGNOSIS — R29898 Other symptoms and signs involving the musculoskeletal system: Secondary | ICD-10-CM

## 2022-10-16 DIAGNOSIS — M25512 Pain in left shoulder: Secondary | ICD-10-CM | POA: Diagnosis not present

## 2022-10-16 NOTE — Therapy (Signed)
OUTPATIENT OCCUPATIONAL THERAPY ORTHO TREATMENT SESSION   Patient Name: Lori Duncan MRN: [id number] DOB:Oct 31, 1975, 47 y.o., female Today's Date: 10/16/2022  PCP: Dr. Assunta Found REFERRING PROVIDER: Dr. Ramond Marrow  END OF SESSION:  OT End of Session - 10/16/22 1331     Visit Number 5    Number of Visits 16    Date for OT Re-Evaluation 11/29/22   mini-reassessment 6/12   Authorization Type [employer]    Authorization Time Period 60 visit limit    Authorization - Visit Number 4    Authorization - Number of Visits 60    OT Start Time 1252    OT Stop Time 1330    OT Time Calculation (min) 38 min    Activity Tolerance Patient tolerated treatment well    Behavior During Therapy [facility] for tasks assessed/performed             Past Medical History:  Diagnosis Date   Asthma    Chlamydia 10/2009   LGSIL (low grade squamous intraepithelial dysplasia) 11/2010   Migraines    Smoker    STD (sexually transmitted disease)    Chlamydia   Past Surgical History:  Procedure Laterality Date   CERVICAL BIOPSY  W/ LOOP ELECTRODE EXCISION     COLPOSCOPY  11/2010   INTRAUTERINE DEVICE INSERTION  12/2009   LEEP  2002   SHOULDER SURGERY Right    TONSILLECTOMY AND ADENOIDECTOMY     Patient Active Problem List   Diagnosis Date Noted   Smoker    Dysplasia of cervix     ONSET DATE: 09/24/22  REFERRING DIAG: LEFT SHOULDER ARTHROSCOPY WITH SUBACROMIAL DECOMPRESSION,DISTAL CLAVICLE EXCISION, BICEPS TENODESIS   THERAPY DIAG:  Other symptoms and signs involving the musculoskeletal system  Acute pain of left shoulder  Stiffness of left shoulder, not elsewhere classified  Rationale for Evaluation and Treatment: Rehabilitation  SUBJECTIVE:   SUBJECTIVE STATEMENT: S: I've been removing the pillow some and have some mild soreness.   PERTINENT HISTORY: Pt is a 47 y/o female s/p left shoulder arthroscopy with subacromial decompression, distal clavicle excision, and bicep tenodesis on  09/24/22. Pt presents in Don Joy abduction sling, reports she has been moving her hand and wrist, has not taken sling off except for bathing/dressing.   PRECAUTIONS: Shoulder See protocol.   WEIGHT BEARING RESTRICTIONS: Yes NWB  PAIN:  Are you having pain? Yes: NPRS scale: 1/10 Pain location: aching Pain description: left shoulder Aggravating factors: movement Relieving factors: ice  FALLS: Has patient fallen in last 6 months? No  PLOF: Independent  PATIENT GOALS: To be able to use the LUE  NEXT MD VISIT: 10/23/22  OBJECTIVE:   HAND DOMINANCE: Right  ADLs: Overall ADLs: Pt is unable to use the LUE for any ADLs, is sleeping in the recliner. 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    FUNCTIONAL OUTCOME MEASURES: FOTO: 13/100   UPPER EXTREMITY ROM:       Assessed supine, er/IR adducted  Passive ROM Left eval  Shoulder flexion 124  Shoulder abduction 141  Shoulder internal rotation 90  Shoulder external rotation 52  (Blank rows = not tested)   UPPER EXTREMITY MMT:       Not assessed due to precautions and pain  MMT Left eval  Shoulder flexion   Shoulder abduction   Shoulder internal rotation   Shoulder external rotation   (Blank rows = not tested)  SENSATION: Occasional tingling/numbness in hand/fingers  OBSERVATIONS: Min/mod fascial restrictions along anterior shoulder, trapezius, and scapular regions   TODAY'S TREATMENT:                                                                                                                              DATE:     10/16/22 -Manual Therapy: myofascial release and trigger point applied to biceps, trapezius, scapular region and axillary region in order to reduce pain and fascial restrictions to improve ROM.  -P/ROM: flexion, abduction, er/IR, x10 -Scapular A/ROM: extension, retraction, elevation/depression, 10 reps -Anterior glide: 3x10" -Caudle glide: 3x10" -Low level wall  wash: 1'  -Low level thumb tacks: 1'  -Therapy ball stretches: flexion, abduction, 10 reps with 5" holds at end ROM  10/09/22 -Manual Therapy: myofascial release and trigger point applied to biceps, trapezius, scapular region and axillary region in order to reduce pain and fascial restrictions to improve ROM.  -P/ROM: flexion, abduction, er/IR, x10 -Isometrics: supine, flexion, extension, abduction, er, IR, 5x10" -Low level ball on the wall: vertical, horizontal, circles both directions, x10  -Wall Slides: flexion x5 -Wall Wash x60" -Scapula ROM: extension, retraction, rows, x10  10/07/22 -Manual Therapy: myofascial release and trigger point applied to biceps, trapezius, scapular region and axillary region in order to reduce pain and fascial restrictions to improve ROM.  -P/ROM: flexion, abduction, er/IR, x15 -Ball Rolls: flexion and abduction x15 -Thumb tacs x60" -Low level wall wash x60" -Shoulder shrugs x10 -scap retraction x10   PATIENT EDUCATION: Education details: Reviewed HEP Person educated: Patient Education method: Explanation, Demonstration, and Handouts Education comprehension: verbalized understanding and returned demonstration  HOME EXERCISE PROGRAM: Eval: pendulums, table slides, wrist/forearm A/ROM, elbow AA/ROM\ 5/17: cervical ROM 5/23: Scapular ROM  GOALS: Goals reviewed with patient? Yes  SHORT TERM GOALS: Target date: 10/30/22  Pt will be provided with and educated on HEP to improve mobility in LUE required for use during ADL completion.   Goal status: IN PROGRESS  2.  Pt will increase LUE P/ROM by 30 degrees or greater to improve ability to use LUE during dressing tasks with minimal compensatory techniques.   Goal status: IN PROGRESS  3.  Pt will increase LUE strength to 3+/5 to improve ability to reach for items at waist to chest height during bathing and grooming tasks.   Goal status: IN PROGRESS    LONG TERM GOALS: Target date: 11/30/22  Pt  will decrease pain in LUE to 3/10 or less to improve ability to sleep for 2+ consecutive hours without waking due to pain.   Goal status: IN PROGRESS  2.  Pt will decrease LUE fascial restrictions to trace amounts or less to improve mobility required for functional reaching tasks.   Goal status: IN PROGRESS  3.  Pt will increase LUE A/ROM to 145 degrees or greater for flexion and abduction, and 50 degrees or greater for er, to improve ability to use LUE when reaching overhead or behind back during dressing and bathing tasks.  Goal status: IN PROGRESS  4.  Pt will increase LUE strength to 4+/5 or greater to improve ability to use LUE when lifting or carrying items during meal preparation/housework/yardwork tasks.   Goal status: IN PROGRESS  5.  Pt will return to highest level of function using LUE as non-dominant during functional task completion.   Goal status: IN PROGRESS  ASSESSMENT:  CLINICAL IMPRESSION: Pt reports she has been slowly transitioning to a different sling without the pillow. Did have some soreness and replaced the pillow with relief. Pt is making excellent progress, is tolerating full ROM during passive stretching. Completed manual techniques targeting medial deltoid, trapezius, and scapular regions today. Pt performing scapular mobility, therapy ball stretches with holds, and added anterior and caudle glides today. Verbal cuing for form and technique. Discussed reducing frequency to 1x/week until pt is able to begin phase II on 6/19, to which pt is agreeable.    PERFORMANCE DEFICITS: in functional skills including ADLs, IADLs, ROM, strength, pain, fascial restrictions, and UE functional use   PLAN:  OT FREQUENCY: 2x/week  OT DURATION: 8 weeks  PLANNED INTERVENTIONS: self care/ADL training, therapeutic exercise, therapeutic activity, manual therapy, scar mobilization, passive range of motion, splinting, electrical stimulation, ultrasound, patient/family education,  and DME and/or AE instructions  CONSULTED AND AGREED WITH PLAN OF CARE: Patient  PLAN FOR NEXT SESSION: Continue with Phase I of protocol, measure for MD appt   Ezra Sites, OTR/L  [phone] 10/16/2022, 1:31 PM

## 2022-10-21 ENCOUNTER — Encounter (HOSPITAL_COMMUNITY): Payer: BC Managed Care – PPO | Admitting: Occupational Therapy

## 2022-10-23 ENCOUNTER — Ambulatory Visit (HOSPITAL_COMMUNITY): Payer: No Typology Code available for payment source | Attending: Orthopaedic Surgery | Admitting: Occupational Therapy

## 2022-10-23 DIAGNOSIS — M25612 Stiffness of left shoulder, not elsewhere classified: Secondary | ICD-10-CM | POA: Insufficient documentation

## 2022-10-23 DIAGNOSIS — M25512 Pain in left shoulder: Secondary | ICD-10-CM | POA: Diagnosis present

## 2022-10-23 DIAGNOSIS — R29898 Other symptoms and signs involving the musculoskeletal system: Secondary | ICD-10-CM | POA: Diagnosis present

## 2022-10-23 NOTE — Therapy (Signed)
OUTPATIENT OCCUPATIONAL THERAPY ORTHO TREATMENT SESSION   Patient Name: Lori Duncan MRN: [id number] DOB:[birth year], 47 y.o., female Today's Date: 10/23/2022  PCP: Dr. Assunta Found REFERRING PROVIDER: Dr. Ramond Marrow  END OF SESSION:  OT End of Session - 10/23/22 1336     Visit Number 6    Number of Visits 16    Date for OT Re-Evaluation 11/29/22   mini-reassessment 6/12   Authorization Type [employer]    Authorization Time Period 60 visit limit    Authorization - Visit Number 5    Authorization - Number of Visits 60    OT Start Time 1304    OT Stop Time 1344    OT Time Calculation (min) 40 min    Activity Tolerance Patient tolerated treatment well    Behavior During Therapy [facility] for tasks assessed/performed              Past Medical History:  Diagnosis Date   Asthma    Chlamydia 10/2009   LGSIL (low grade squamous intraepithelial dysplasia) 11/2010   Migraines    Smoker    STD (sexually transmitted disease)    Chlamydia   Past Surgical History:  Procedure Laterality Date   CERVICAL BIOPSY  W/ LOOP ELECTRODE EXCISION     COLPOSCOPY  11/2010   INTRAUTERINE DEVICE INSERTION  12/2009   LEEP  2002   SHOULDER SURGERY Right    TONSILLECTOMY AND ADENOIDECTOMY     Patient Active Problem List   Diagnosis Date Noted   Smoker    Dysplasia of cervix     ONSET DATE: 09/24/22  REFERRING DIAG: LEFT SHOULDER ARTHROSCOPY WITH SUBACROMIAL DECOMPRESSION,DISTAL CLAVICLE EXCISION, BICEPS TENODESIS   THERAPY DIAG:  Other symptoms and signs involving the musculoskeletal system  Acute pain of left shoulder  Stiffness of left shoulder, not elsewhere classified  Rationale for Evaluation and Treatment: Rehabilitation  SUBJECTIVE:   SUBJECTIVE STATEMENT: S: I'm trying to sleep in the bed.  PERTINENT HISTORY: Pt is a 47 y/o female s/p left shoulder arthroscopy with subacromial decompression, distal clavicle excision, and bicep tenodesis on 09/24/22. Pt presents in [employer] Joy  abduction sling, reports she has been moving her hand and wrist, has not taken sling off except for bathing/dressing.   PRECAUTIONS: Shoulder See protocol.   WEIGHT BEARING RESTRICTIONS: Yes NWB  PAIN:  Are you having pain? No  FALLS: Has patient fallen in last 6 months? No  PLOF: Independent  PATIENT GOALS: To be able to use the LUE  NEXT MD VISIT: 10/23/22  OBJECTIVE:   HAND DOMINANCE: Right  ADLs: Overall ADLs: Pt is unable to use the LUE for any ADLs, is sleeping in the recliner. 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    FUNCTIONAL OUTCOME MEASURES: FOTO: 13/100   UPPER EXTREMITY ROM:       Assessed supine, er/IR adducted  Passive ROM Left eval Left 10/23/22  Shoulder flexion 124 152  Shoulder abduction 141 175  Shoulder internal rotation 90 90  Shoulder external rotation 52 50  (Blank rows = not tested)   UPPER EXTREMITY MMT:       Not assessed due to precautions and pain  MMT Left eval  Shoulder flexion   Shoulder abduction   Shoulder internal rotation   Shoulder external rotation   (Blank rows = not tested)  SENSATION: Occasional tingling/numbness in hand/fingers  OBSERVATIONS: Min/mod fascial restrictions along anterior shoulder, trapezius, and scapular regions   TODAY'S  TREATMENT:                                                                                                                              DATE:    10/23/22 -Manual Therapy: myofascial release and trigger point applied to biceps, trapezius, scapular region and axillary region in order to reduce pain and fascial restrictions to improve ROM.  -P/ROM: flexion, abduction, er/IR, x10 -Scapular A/ROM: extension, retraction, elevation/depression, 15 reps -Low level wall wash: 1'  -Low level thumb tacks: 1'  -Prot/ret/elev/dep: 1' low level -Therapy ball stretches: flexion, abduction, 10 reps with 5" holds at end ROM -Pulleys: 1' flexion, 1'  abduction  10/16/22 -Manual Therapy: myofascial release and trigger point applied to biceps, trapezius, scapular region and axillary region in order to reduce pain and fascial restrictions to improve ROM.  -P/ROM: flexion, abduction, er/IR, x10 -Scapular A/ROM: extension, retraction, elevation/depression, 10 reps -Anterior glide: 3x10" -Caudle glide: 3x10" -Low level wall wash: 1'  -Low level thumb tacks: 1'  -Therapy ball stretches: flexion, abduction, 10 reps with 5" holds at end ROM  10/09/22 -Manual Therapy: myofascial release and trigger point applied to biceps, trapezius, scapular region and axillary region in order to reduce pain and fascial restrictions to improve ROM.  -P/ROM: flexion, abduction, er/IR, x10 -Isometrics: supine, flexion, extension, abduction, er, IR, 5x10" -Low level ball on the wall: vertical, horizontal, circles both directions, x10  -Wall Slides: flexion x5 -Wall Wash x60" -Scapula ROM: extension, retraction, rows, x10   PATIENT EDUCATION: Education details: Reviewed HEP Person educated: Patient Education method: Programmer, multimedia, Demonstration, and Handouts Education comprehension: verbalized understanding and returned demonstration  HOME EXERCISE PROGRAM: Eval: pendulums, table slides, wrist/forearm A/ROM, elbow AA/ROM\ 5/17: cervical ROM 5/23: Scapular ROM  GOALS: Goals reviewed with patient? Yes  SHORT TERM GOALS: Target date: 10/30/22  Pt will be provided with and educated on HEP to improve mobility in LUE required for use during ADL completion.   Goal status: IN PROGRESS  2.  Pt will increase LUE P/ROM by 30 degrees or greater to improve ability to use LUE during dressing tasks with minimal compensatory techniques.   Goal status: IN PROGRESS  3.  Pt will increase LUE strength to 3+/5 to improve ability to reach for items at waist to chest height during bathing and grooming tasks.   Goal status: IN PROGRESS    LONG TERM GOALS: Target date:  11/30/22  Pt will decrease pain in LUE to 3/10 or less to improve ability to sleep for 2+ consecutive hours without waking due to pain.   Goal status: IN PROGRESS  2.  Pt will decrease LUE fascial restrictions to trace amounts or less to improve mobility required for functional reaching tasks.   Goal status: IN PROGRESS  3.  Pt will increase LUE A/ROM to 145 degrees or greater for flexion and abduction, and 50 degrees or greater for er, to improve ability to use LUE when reaching overhead or behind  back during dressing and bathing tasks.   Goal status: IN PROGRESS  4.  Pt will increase LUE strength to 4+/5 or greater to improve ability to use LUE when lifting or carrying items during meal preparation/housework/yardwork tasks.   Goal status: IN PROGRESS  5.  Pt will return to highest level of function using LUE as non-dominant during functional task completion.   Goal status: IN PROGRESS  ASSESSMENT:  CLINICAL IMPRESSION: Pt reports she has been working on sleeping in the bed, can get a couple hours then go back to the recliner. Pt is making excellent progress, is tolerating full ROM during passive stretching. Continued with scapular mobility, therapy ball stretches with holds, and added low level scapular prot/ret/elev/dep and added pulleys. Verbal cuing for form and technique.    PERFORMANCE DEFICITS: in functional skills including ADLs, IADLs, ROM, strength, pain, fascial restrictions, and UE functional use   PLAN:  OT FREQUENCY: 2x/week  OT DURATION: 8 weeks  PLANNED INTERVENTIONS: self care/ADL training, therapeutic exercise, therapeutic activity, manual therapy, scar mobilization, passive range of motion, splinting, electrical stimulation, ultrasound, patient/family education, and DME and/or AE instructions  CONSULTED AND AGREED WITH PLAN OF CARE: Patient  PLAN FOR NEXT SESSION: follow up on MD appt, continue with phase I until 6/19 then transition to phase II   Ezra Sites, OTR/L  [phone] 10/23/2022, 1:53 PM

## 2022-10-31 ENCOUNTER — Ambulatory Visit (HOSPITAL_COMMUNITY): Payer: No Typology Code available for payment source | Admitting: Occupational Therapy

## 2022-10-31 ENCOUNTER — Encounter (HOSPITAL_COMMUNITY): Payer: Self-pay | Admitting: Occupational Therapy

## 2022-10-31 DIAGNOSIS — R29898 Other symptoms and signs involving the musculoskeletal system: Secondary | ICD-10-CM | POA: Diagnosis not present

## 2022-10-31 DIAGNOSIS — M25512 Pain in left shoulder: Secondary | ICD-10-CM

## 2022-10-31 DIAGNOSIS — M25612 Stiffness of left shoulder, not elsewhere classified: Secondary | ICD-10-CM

## 2022-10-31 NOTE — Patient Instructions (Signed)

## 2022-10-31 NOTE — Therapy (Signed)
OUTPATIENT OCCUPATIONAL THERAPY ORTHO TREATMENT SESSION   Patient Name: Lori Duncan MRN: [id number] DOB:[birth year], 47 y.o., female Today's Date: 10/31/2022  PCP: Dr. Assunta Found REFERRING PROVIDER: Dr. Ramond Marrow  END OF SESSION:  OT End of Session - 10/31/22 1422     Visit Number 7    Number of Visits 16    Date for OT Re-Evaluation 11/29/22   mini-reassessment 6/12   Authorization Type [employer]    Authorization Time Period 60 visit limit    Authorization - Visit Number 6    Authorization - Number of Visits 60    OT Start Time 1344    OT Stop Time 1422    OT Time Calculation (min) 38 min    Activity Tolerance Patient tolerated treatment well    Behavior During Therapy WFL for tasks assessed/performed               Past Medical History:  Diagnosis Date   Asthma    Chlamydia 10/2009   LGSIL (low grade squamous intraepithelial dysplasia) 11/2010   Migraines    Smoker    STD (sexually transmitted disease)    Chlamydia   Past Surgical History:  Procedure Laterality Date   CERVICAL BIOPSY  W/ LOOP ELECTRODE EXCISION     COLPOSCOPY  11/2010   INTRAUTERINE DEVICE INSERTION  12/2009   LEEP  2002   SHOULDER SURGERY Right    TONSILLECTOMY AND ADENOIDECTOMY     Patient Active Problem List   Diagnosis Date Noted   Smoker    Dysplasia of cervix     ONSET DATE: 09/24/22  REFERRING DIAG: LEFT SHOULDER ARTHROSCOPY WITH SUBACROMIAL DECOMPRESSION,DISTAL CLAVICLE EXCISION, BICEPS TENODESIS   THERAPY DIAG:  Other symptoms and signs involving the musculoskeletal system  Acute pain of left shoulder  Stiffness of left shoulder, not elsewhere classified  Rationale for Evaluation and Treatment: Rehabilitation  SUBJECTIVE:   SUBJECTIVE STATEMENT: S: I'm out of the sling now.   PERTINENT HISTORY: Pt is a 47 y/o female s/p left shoulder arthroscopy with subacromial decompression, distal clavicle excision, and bicep tenodesis on 09/24/22. Pt presents in [employer]  abduction sling, reports she has been moving her hand and wrist, has not taken sling off except for bathing/dressing.   PRECAUTIONS: Shoulder See protocol.   WEIGHT BEARING RESTRICTIONS: Yes NWB  PAIN:  Are you having pain? No  FALLS: Has patient fallen in last 6 months? No  PLOF: Independent  PATIENT GOALS: To be able to use the LUE  NEXT MD VISIT: 11/18/22  OBJECTIVE:   HAND DOMINANCE: Right  ADLs: Overall ADLs: Pt is unable to use the LUE for any ADLs, is sleeping in the recliner. 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    FUNCTIONAL OUTCOME MEASURES: FOTO: 13/100   UPPER EXTREMITY ROM:       Assessed supine, er/IR adducted  Passive ROM Left eval Left 10/23/22  Shoulder flexion 124 152  Shoulder abduction 141 175  Shoulder internal rotation 90 90  Shoulder external rotation 52 50  (Blank rows = not tested)   UPPER EXTREMITY MMT:       Not assessed due to precautions and pain  MMT Left eval  Shoulder flexion   Shoulder abduction   Shoulder internal rotation   Shoulder external rotation   (Blank rows = not tested)  SENSATION: Occasional tingling/numbness in hand/fingers  OBSERVATIONS: Min/mod fascial restrictions along anterior shoulder, trapezius, and scapular regions  TODAY'S TREATMENT:                                                                                                                              DATE:    10/31/22 -Manual Therapy: myofascial release and trigger point applied to biceps, trapezius, scapular region and axillary region in order to reduce pain and fascial restrictions to improve ROM.  -P/ROM: flexion, abduction, er/IR, horizontal abduction, 5 reps -AA/ROM: supine-protraction, flexion, horizontal abduction, er/IR, abduction, 10 reps, 2 rounds -Wall wash: 1'  -Thumb tacks: 1'  -Prot/ret/elev/dep: 1'  -Pulleys: 2' flexion, 2' abduction  10/23/22 -Manual Therapy: myofascial release and  trigger point applied to biceps, trapezius, scapular region and axillary region in order to reduce pain and fascial restrictions to improve ROM.  -P/ROM: flexion, abduction, er/IR, x10 -Scapular A/ROM: extension, retraction, elevation/depression, 15 reps -Low level wall wash: 1'  -Low level thumb tacks: 1'  -Prot/ret/elev/dep: 1' low level -Therapy ball stretches: flexion, abduction, 10 reps with 5" holds at end ROM -Pulleys: 1' flexion, 1' abduction  10/16/22 -Manual Therapy: myofascial release and trigger point applied to biceps, trapezius, scapular region and axillary region in order to reduce pain and fascial restrictions to improve ROM.  -P/ROM: flexion, abduction, er/IR, x10 -Scapular A/ROM: extension, retraction, elevation/depression, 10 reps -Anterior glide: 3x10" -Caudle glide: 3x10" -Low level wall wash: 1'  -Low level thumb tacks: 1'  -Therapy ball stretches: flexion, abduction, 10 reps with 5" holds at end ROM   PATIENT EDUCATION: Education details: AA/ROM-supine only Person educated: Patient Education method: Programmer, multimedia, Demonstration, and Handouts Education comprehension: verbalized understanding and returned demonstration  HOME EXERCISE PROGRAM: Eval: pendulums, table slides, wrist/forearm A/ROM, elbow AA/ROM\ 5/17: cervical ROM 5/23: Scapular ROM 6/14: AA/ROM-supine only   GOALS: Goals reviewed with patient? Yes  SHORT TERM GOALS: Target date: 10/30/22  Pt will be provided with and educated on HEP to improve mobility in LUE required for use during ADL completion.   Goal status: IN PROGRESS  2.  Pt will increase LUE P/ROM by 30 degrees or greater to improve ability to use LUE during dressing tasks with minimal compensatory techniques.   Goal status: IN PROGRESS  3.  Pt will increase LUE strength to 3+/5 to improve ability to reach for items at waist to chest height during bathing and grooming tasks.   Goal status: IN PROGRESS    LONG TERM GOALS: Target  date: 11/30/22  Pt will decrease pain in LUE to 3/10 or less to improve ability to sleep for 2+ consecutive hours without waking due to pain.   Goal status: IN PROGRESS  2.  Pt will decrease LUE fascial restrictions to trace amounts or less to improve mobility required for functional reaching tasks.   Goal status: IN PROGRESS  3.  Pt will increase LUE A/ROM to 145 degrees or greater for flexion and abduction, and 50 degrees or greater for er, to improve ability to use LUE when reaching overhead or  behind back during dressing and bathing tasks.   Goal status: IN PROGRESS  4.  Pt will increase LUE strength to 4+/5 or greater to improve ability to use LUE when lifting or carrying items during meal preparation/housework/yardwork tasks.   Goal status: IN PROGRESS  5.  Pt will return to highest level of function using LUE as non-dominant during functional task completion.   Goal status: IN PROGRESS  ASSESSMENT:  CLINICAL IMPRESSION: Pt reports MD released her from the sling, she is using her RUE actively during ADLs with a 5# lifting restriction. Continued with manual techniques, notable improvement in fascial restrictions today, continues to have restrictions along trapezius and medial border of scapula. Progressed to AA/ROM in supine, completing two rounds, pt demonstrating full ROM with exception of some tightness with er. Also completing door activities at shoulder height versus low level. Verbal cuing for form and technique.    PERFORMANCE DEFICITS: in functional skills including ADLs, IADLs, ROM, strength, pain, fascial restrictions, and UE functional use   PLAN:  OT FREQUENCY: 2x/week  OT DURATION: 8 weeks  PLANNED INTERVENTIONS: self care/ADL training, therapeutic exercise, therapeutic activity, manual therapy, scar mobilization, passive range of motion, splinting, electrical stimulation, ultrasound, patient/family education, and DME and/or AE instructions  CONSULTED AND  AGREED WITH PLAN OF CARE: Patient  PLAN FOR NEXT SESSION: mini-reassessment, fully transition to phase II-complete AA/ROM in supine and standing, add scapular theraband, proximal shoulder strengthening   Ezra Sites, OTR/L  [phone] 10/31/2022, 2:23 PM

## 2022-11-03 ENCOUNTER — Encounter (HOSPITAL_COMMUNITY): Payer: BC Managed Care – PPO | Admitting: Occupational Therapy

## 2022-11-05 ENCOUNTER — Ambulatory Visit (HOSPITAL_COMMUNITY): Payer: No Typology Code available for payment source | Admitting: Occupational Therapy

## 2022-11-05 ENCOUNTER — Encounter (HOSPITAL_COMMUNITY): Payer: Self-pay | Admitting: Occupational Therapy

## 2022-11-05 DIAGNOSIS — R29898 Other symptoms and signs involving the musculoskeletal system: Secondary | ICD-10-CM

## 2022-11-05 DIAGNOSIS — M25512 Pain in left shoulder: Secondary | ICD-10-CM

## 2022-11-05 DIAGNOSIS — M25612 Stiffness of left shoulder, not elsewhere classified: Secondary | ICD-10-CM

## 2022-11-05 NOTE — Patient Instructions (Signed)

## 2022-11-05 NOTE — Therapy (Signed)
OUTPATIENT OCCUPATIONAL THERAPY ORTHO TREATMENT SESSION  MINI REASSESSMENT  Patient Name: Lori Duncan MRN: [id number] DOB:1975/09/20, 47 y.o., female Today's Date: 11/05/2022  PCP: Dr. Assunta Found REFERRING PROVIDER: Dr. Ramond Marrow  Progress Note Reporting Period 10/23/22 to 11/05/22  See note below for Objective Data and Assessment of Progress/Goals.    END OF SESSION:  OT End of Session - 11/05/22 1023     Visit Number 8    Number of Visits 16    Date for OT Re-Evaluation 11/29/22    Authorization Type [employer]    Authorization Time Period 60 visit limit    Authorization - Visit Number 7    Authorization - Number of Visits 60    OT Start Time [phone]    OT Stop Time 1028    OT Time Calculation (min) 41 min    Activity Tolerance Patient tolerated treatment well    Behavior During Therapy [facility] for tasks assessed/performed             Past Medical History:  Diagnosis Date   Asthma    Chlamydia 10/2009   LGSIL (low grade squamous intraepithelial dysplasia) 11/2010   Migraines    Smoker    STD (sexually transmitted disease)    Chlamydia   Past Surgical History:  Procedure Laterality Date   CERVICAL BIOPSY  W/ LOOP ELECTRODE EXCISION     COLPOSCOPY  11/2010   INTRAUTERINE DEVICE INSERTION  12/2009   LEEP  2002   SHOULDER SURGERY Right    TONSILLECTOMY AND ADENOIDECTOMY     Patient Active Problem List   Diagnosis Date Noted   Smoker    Dysplasia of cervix     ONSET DATE: 09/24/22  REFERRING DIAG: LEFT SHOULDER ARTHROSCOPY WITH SUBACROMIAL DECOMPRESSION,DISTAL CLAVICLE EXCISION, BICEPS TENODESIS   THERAPY DIAG:  Other symptoms and signs involving the musculoskeletal system  Acute pain of left shoulder  Stiffness of left shoulder, not elsewhere classified  Rationale for Evaluation and Treatment: Rehabilitation  SUBJECTIVE:   SUBJECTIVE STATEMENT: S: "Starting the PVC pipe exercises has made me sore."   PERTINENT HISTORY: Pt is a 47 y/o female  s/p left shoulder arthroscopy with subacromial decompression, distal clavicle excision, and bicep tenodesis on 09/24/22. Pt presents in [employer] Joy abduction sling, reports she has been moving her hand and wrist, has not taken sling off except for bathing/dressing.   PRECAUTIONS: Shoulder See protocol.   WEIGHT BEARING RESTRICTIONS: Yes NWB  PAIN:  Are you having pain? Yes: NPRS scale: 2/10 Pain location: deltoid and biceps Pain description: dull ache Aggravating factors: exercises Relieving factors: tylenol  FALLS: Has patient fallen in last 6 months? No  PLOF: Independent  PATIENT GOALS: To be able to use the LUE  NEXT MD VISIT: 11/18/22  OBJECTIVE:   HAND DOMINANCE: Right  ADLs: Overall ADLs: Pt is unable to use the LUE for any ADLs, is sleeping in the recliner. 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    FUNCTIONAL OUTCOME MEASURES: FOTO: 13/100 11/05/22: 49.69/100   UPPER EXTREMITY ROM:       Assessed supine, er/IR adducted  Passive ROM Left eval Left 10/23/22 Left 11/05/22  Shoulder flexion 124 152 159  Shoulder abduction 141 175 176  Shoulder internal rotation 90 90 90  Shoulder external rotation 52 50 56  (Blank rows = not tested)   UPPER EXTREMITY MMT:       Not assessed due to precautions and pain  MMT Left eval  Shoulder flexion   Shoulder abduction   Shoulder internal rotation   Shoulder external rotation   (Blank rows = not tested)  SENSATION: Occasional tingling/numbness in hand/fingers  OBSERVATIONS: Min/mod fascial restrictions along anterior shoulder, trapezius, and scapular regions   TODAY'S TREATMENT:                                                                                                                              DATE:    11/05/22 -Manual Therapy: myofascial release and trigger point applied to biceps, trapezius, scapular region and axillary region in order to reduce pain and fascial  restrictions to improve ROM.  -P/ROM: flexion, abduction, er/IR, horizontal abduction, 5 reps -AA/ROM: supine, 2lb dowel, protraction, flexion, horizontal abduction, er/IR, abduction, x15 -Isometrics: flexion, abduction, er, extension, 5x15" -Measurements for reassessment  10/31/22 -Manual Therapy: myofascial release and trigger point applied to biceps, trapezius, scapular region and axillary region in order to reduce pain and fascial restrictions to improve ROM.  -P/ROM: flexion, abduction, er/IR, horizontal abduction, 5 reps -AA/ROM: supine-protraction, flexion, horizontal abduction, er/IR, abduction, 10 reps, 2 rounds -Wall wash: 1'  -Thumb tacks: 1'  -Prot/ret/elev/dep: 1'  -Pulleys: 2' flexion, 2' abduction  10/23/22 -Manual Therapy: myofascial release and trigger point applied to biceps, trapezius, scapular region and axillary region in order to reduce pain and fascial restrictions to improve ROM.  -P/ROM: flexion, abduction, er/IR, x10 -Scapular A/ROM: extension, retraction, elevation/depression, 15 reps -Low level wall wash: 1'  -Low level thumb tacks: 1'  -Prot/ret/elev/dep: 1' low level -Therapy ball stretches: flexion, abduction, 10 reps with 5" holds at end ROM -Pulleys: 1' flexion, 1' abduction   PATIENT EDUCATION: Education details: Isometrics Person educated: Patient Education method: Explanation, Demonstration, and Handouts Education comprehension: verbalized understanding and returned demonstration  HOME EXERCISE PROGRAM: Eval: pendulums, table slides, wrist/forearm A/ROM, elbow AA/ROM\ 5/17: cervical ROM 5/23: Scapular ROM 6/14: AA/ROM-supine only  6/19: Isometrics  GOALS: Goals reviewed with patient? Yes  SHORT TERM GOALS: Target date: 10/30/22  Pt will be provided with and educated on HEP to improve mobility in LUE required for use during ADL completion.   Goal status: IN PROGRESS  2.  Pt will increase LUE P/ROM by 30 degrees or greater to improve  ability to use LUE during dressing tasks with minimal compensatory techniques.   Goal status: IN PROGRESS  3.  Pt will increase LUE strength to 3+/5 to improve ability to reach for items at waist to chest height during bathing and grooming tasks.   Goal status: IN PROGRESS    LONG TERM GOALS: Target date: 11/30/22  Pt will decrease pain in LUE to 3/10 or less to improve ability to sleep for 2+ consecutive hours without waking due to pain.   Goal status: IN PROGRESS  2.  Pt will decrease LUE fascial restrictions to trace amounts or less to improve mobility required for functional reaching tasks.   Goal status: IN PROGRESS  3.  Pt will increase LUE A/ROM to 145 degrees or greater for flexion and abduction, and 50 degrees or greater for er, to improve ability to use LUE when reaching overhead or behind back during dressing and bathing tasks.   Goal status: IN PROGRESS  4.  Pt will increase LUE strength to 4+/5 or greater to improve ability to use LUE when lifting or carrying items during meal preparation/housework/yardwork tasks.   Goal status: IN PROGRESS  5.  Pt will return to highest level of function using LUE as non-dominant during functional task completion.   Goal status: IN PROGRESS  ASSESSMENT:  CLINICAL IMPRESSION: This session pt completed a mini reassessment where she is demonstrating improved ROM and feels that she is able to complete more tasks at home with less difficulty. She is continuing with phase 2 of her protocol, having good AA/ROM where she is reaching full ROM. OT added isometrics this session, where pt reported feeling fatigued, however she was able to finish all exercises with no rest breaks. Verbal and visual cuing provided for positioning and technique throughout session.    PERFORMANCE DEFICITS: in functional skills including ADLs, IADLs, ROM, strength, pain, fascial restrictions, and UE functional use   PLAN:  OT FREQUENCY: 2x/week  OT DURATION:  8 weeks  PLANNED INTERVENTIONS: self care/ADL training, therapeutic exercise, therapeutic activity, manual therapy, scar mobilization, passive range of motion, splinting, electrical stimulation, ultrasound, patient/family education, and DME and/or AE instructions  CONSULTED AND AGREED WITH PLAN OF CARE: Patient  PLAN FOR NEXT SESSION: fully transition to phase II-complete AA/ROM in supine and standing, add scapular theraband, proximal shoulder strengthening, Isometrics   Trish Mage, OTR/L [phone] 11/05/2022, 10:30 AM

## 2022-11-10 ENCOUNTER — Encounter (HOSPITAL_COMMUNITY): Payer: Self-pay | Admitting: Occupational Therapy

## 2022-11-10 ENCOUNTER — Ambulatory Visit (HOSPITAL_COMMUNITY): Payer: No Typology Code available for payment source | Admitting: Occupational Therapy

## 2022-11-10 DIAGNOSIS — R29898 Other symptoms and signs involving the musculoskeletal system: Secondary | ICD-10-CM

## 2022-11-10 DIAGNOSIS — M25612 Stiffness of left shoulder, not elsewhere classified: Secondary | ICD-10-CM

## 2022-11-10 DIAGNOSIS — M25512 Pain in left shoulder: Secondary | ICD-10-CM

## 2022-11-10 NOTE — Therapy (Signed)
OUTPATIENT OCCUPATIONAL THERAPY ORTHO TREATMENT SESSION    Patient Name: Lori Duncan MRN: [id number] DOB:[birth year], 47 y.o., female Today's Date: 11/10/2022  PCP: Dr. Assunta Found REFERRING PROVIDER: Dr. Ramond Marrow   END OF SESSION:  OT End of Session - 11/10/22 1048     Visit Number 9    Number of Visits 16    Date for OT Re-Evaluation 11/29/22    Authorization Type [employer]    Authorization Time Period 60 visit limit    Authorization - Visit Number 8    Authorization - Number of Visits 60    OT Start Time [phone]    OT Stop Time 0945    OT Time Calculation (min) 40 min    Activity Tolerance Patient tolerated treatment well    Behavior During Therapy [facility] for tasks assessed/performed              Past Medical History:  Diagnosis Date   Asthma    Chlamydia 10/2009   LGSIL (low grade squamous intraepithelial dysplasia) 11/2010   Migraines    Smoker    STD (sexually transmitted disease)    Chlamydia   Past Surgical History:  Procedure Laterality Date   CERVICAL BIOPSY  W/ LOOP ELECTRODE EXCISION     COLPOSCOPY  11/2010   INTRAUTERINE DEVICE INSERTION  12/2009   LEEP  2002   SHOULDER SURGERY Right    TONSILLECTOMY AND ADENOIDECTOMY     Patient Active Problem List   Diagnosis Date Noted   Smoker    Dysplasia of cervix     ONSET DATE: 09/24/22  REFERRING DIAG: LEFT SHOULDER ARTHROSCOPY WITH SUBACROMIAL DECOMPRESSION,DISTAL CLAVICLE EXCISION, BICEPS TENODESIS   THERAPY DIAG:  Other symptoms and signs involving the musculoskeletal system  Acute pain of left shoulder  Stiffness of left shoulder, not elsewhere classified  Rationale for Evaluation and Treatment: Rehabilitation  SUBJECTIVE:   SUBJECTIVE STATEMENT: S: "I did a lot of moving over the weekend so I'm a little sore."   PERTINENT HISTORY: Pt is a 47 y/o female s/p left shoulder arthroscopy with subacromial decompression, distal clavicle excision, and bicep tenodesis on 09/24/22. Pt presents  in Don Joy abduction sling, reports she has been moving her hand and wrist, has not taken sling off except for bathing/dressing.   PRECAUTIONS: Shoulder See protocol.   WEIGHT BEARING RESTRICTIONS: Yes NWB  PAIN:  Are you having pain? Yes: NPRS scale: 1/10 Pain location: deltoid and biceps Pain description: Pulling Aggravating factors: exercises Relieving factors: tylenol  FALLS: Has patient fallen in last 6 months? No  PLOF: Independent  PATIENT GOALS: To be able to use the LUE  NEXT MD VISIT: 11/18/22  OBJECTIVE:   HAND DOMINANCE: Right  ADLs: Overall ADLs: Pt is unable to use the LUE for any ADLs, is sleeping in the recliner. 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    FUNCTIONAL OUTCOME MEASURES: FOTO: 13/100 11/05/22: 49.69/100   UPPER EXTREMITY ROM:       Assessed supine, er/IR adducted  Passive ROM Left eval Left 10/23/22 Left 11/05/22  Shoulder flexion 124 152 159  Shoulder abduction 141 175 176  Shoulder internal rotation 90 90 90  Shoulder external rotation 52 50 56  (Blank rows = not tested)   UPPER EXTREMITY MMT:       Not assessed due to precautions and pain  MMT Left eval  Shoulder flexion   Shoulder abduction   Shoulder internal rotation  Shoulder external rotation   (Blank rows = not tested)  SENSATION: Occasional tingling/numbness in hand/fingers  OBSERVATIONS: Min/mod fascial restrictions along anterior shoulder, trapezius, and scapular regions   TODAY'S TREATMENT:                                                                                                                              DATE:    11/10/22 -Manual Therapy: myofascial release and trigger point applied to biceps, trapezius, scapular region and axillary region in order to reduce pain and fascial restrictions to improve ROM. -AA/ROM: supine, protraction, flexion, horizontal abduction, er/IR, abduction, x15  -A/ROM: supine, flexion,  abduction, protraction, horizontal abduction, er/IR, x8 -Scapular Strengthening: red band, extension, retraction, rows, x10 -Wall Slides: flexion, abduction, x10  11/05/22 -Manual Therapy: myofascial release and trigger point applied to biceps, trapezius, scapular region and axillary region in order to reduce pain and fascial restrictions to improve ROM.  -P/ROM: flexion, abduction, er/IR, horizontal abduction, 5 reps -AA/ROM: supine, 2lb dowel, protraction, flexion, horizontal abduction, er/IR, abduction, x15 -Isometrics: flexion, abduction, er, extension, 5x15" -Measurements for reassessment  10/31/22 -Manual Therapy: myofascial release and trigger point applied to biceps, trapezius, scapular region and axillary region in order to reduce pain and fascial restrictions to improve ROM.  -P/ROM: flexion, abduction, er/IR, horizontal abduction, 5 reps -AA/ROM: supine-protraction, flexion, horizontal abduction, er/IR, abduction, 10 reps, 2 rounds -Wall wash: 1'  -Thumb tacks: 1'  -Prot/ret/elev/dep: 1'  -Pulleys: 2' flexion, 2' abduction   PATIENT EDUCATION: Education details: Publishing rights manager Person educated: Patient Education method: Explanation, Demonstration, and Handouts Education comprehension: verbalized understanding and returned demonstration  HOME EXERCISE PROGRAM: Eval: pendulums, table slides, wrist/forearm A/ROM, elbow AA/ROM\ 5/17: cervical ROM 5/23: Scapular ROM 6/14: AA/ROM-supine only  6/19: Isometrics 6/24: Scapular strengthening  GOALS: Goals reviewed with patient? Yes  SHORT TERM GOALS: Target date: 10/30/22  Pt will be provided with and educated on HEP to improve mobility in LUE required for use during ADL completion.   Goal status: IN PROGRESS  2.  Pt will increase LUE P/ROM by 30 degrees or greater to improve ability to use LUE during dressing tasks with minimal compensatory techniques.   Goal status: IN PROGRESS  3.  Pt will increase LUE strength  to 3+/5 to improve ability to reach for items at waist to chest height during bathing and grooming tasks.   Goal status: IN PROGRESS    LONG TERM GOALS: Target date: 11/30/22  Pt will decrease pain in LUE to 3/10 or less to improve ability to sleep for 2+ consecutive hours without waking due to pain.   Goal status: IN PROGRESS  2.  Pt will decrease LUE fascial restrictions to trace amounts or less to improve mobility required for functional reaching tasks.   Goal status: IN PROGRESS  3.  Pt will increase LUE A/ROM to 145 degrees or greater for flexion and abduction, and 50 degrees or greater for er, to improve ability to use  LUE when reaching overhead or behind back during dressing and bathing tasks.   Goal status: IN PROGRESS  4.  Pt will increase LUE strength to 4+/5 or greater to improve ability to use LUE when lifting or carrying items during meal preparation/housework/yardwork tasks.   Goal status: IN PROGRESS  5.  Pt will return to highest level of function using LUE as non-dominant during functional task completion.   Goal status: IN PROGRESS  ASSESSMENT:  CLINICAL IMPRESSION: This session pt demonstrated improved ROM. She was reaching full ROM with AA/ROM and 85% of full ROM actively. She reports minimal pain with slight pulling along the posterior aspect of her arm and shoulder. OT added A/ROM, Wall Slides, and Scapular strengthening this session to continue progressing her mobility and strengthening accessory muscles for improved stability. Verbal and tactile cuing provided intermittently for positioning and technique.    PERFORMANCE DEFICITS: in functional skills including ADLs, IADLs, ROM, strength, pain, fascial restrictions, and UE functional use   PLAN:  OT FREQUENCY: 2x/week  OT DURATION: 8 weeks  PLANNED INTERVENTIONS: self care/ADL training, therapeutic exercise, therapeutic activity, manual therapy, scar mobilization, passive range of motion, splinting,  electrical stimulation, ultrasound, patient/family education, and DME and/or AE instructions  CONSULTED AND AGREED WITH PLAN OF CARE: Patient  PLAN FOR NEXT SESSION: fully transition to phase II-complete AA/ROM in supine and standing, add scapular theraband, proximal shoulder strengthening, Isometrics   Trish Mage, OTR/L [phone] 11/10/2022, 10:48 AM

## 2022-11-10 NOTE — Patient Instructions (Signed)

## 2022-11-12 ENCOUNTER — Ambulatory Visit (HOSPITAL_COMMUNITY): Payer: No Typology Code available for payment source | Admitting: Occupational Therapy

## 2022-11-12 ENCOUNTER — Encounter (HOSPITAL_COMMUNITY): Payer: Self-pay | Admitting: Occupational Therapy

## 2022-11-12 DIAGNOSIS — M25612 Stiffness of left shoulder, not elsewhere classified: Secondary | ICD-10-CM

## 2022-11-12 DIAGNOSIS — R29898 Other symptoms and signs involving the musculoskeletal system: Secondary | ICD-10-CM

## 2022-11-12 DIAGNOSIS — M25512 Pain in left shoulder: Secondary | ICD-10-CM

## 2022-11-12 NOTE — Therapy (Signed)
OUTPATIENT OCCUPATIONAL THERAPY ORTHO TREATMENT SESSION AND PROGRESS NOTE   Patient Name: Lori Duncan MRN: [id number] DOB:03/18/1976, 47 y.o., female Today's Date: 11/12/2022  PCP: Dr. Assunta Found REFERRING PROVIDER: Dr. Ramond Marrow  Progress Note Reporting Period 10/03/22 to 11/12/22  See note below for Objective Data and Assessment of Progress/Goals.    END OF SESSION:  OT End of Session - 11/12/22 0941     Visit Number 10    Number of Visits 16    Date for OT Re-Evaluation 11/29/22    Authorization Type [employer]    Authorization Time Period --    Authorization - Visit Number 9    Authorization - Number of Visits 60    OT Start Time [phone]    OT Stop Time 0945    OT Time Calculation (min) 40 min    Activity Tolerance Patient tolerated treatment well    Behavior During Therapy [facility] for tasks assessed/performed              Past Medical History:  Diagnosis Date   Asthma    Chlamydia 10/2009   LGSIL (low grade squamous intraepithelial dysplasia) 11/2010   Migraines    Smoker    STD (sexually transmitted disease)    Chlamydia   Past Surgical History:  Procedure Laterality Date   CERVICAL BIOPSY  W/ LOOP ELECTRODE EXCISION     COLPOSCOPY  11/2010   INTRAUTERINE DEVICE INSERTION  12/2009   LEEP  2002   SHOULDER SURGERY Right    TONSILLECTOMY AND ADENOIDECTOMY     Patient Active Problem List   Diagnosis Date Noted   Smoker    Dysplasia of cervix     ONSET DATE: 09/24/22  REFERRING DIAG: LEFT SHOULDER ARTHROSCOPY WITH SUBACROMIAL DECOMPRESSION,DISTAL CLAVICLE EXCISION, BICEPS TENODESIS   THERAPY DIAG:  Other symptoms and signs involving the musculoskeletal system  Acute pain of left shoulder  Stiffness of left shoulder, not elsewhere classified  Rationale for Evaluation and Treatment: Rehabilitation  SUBJECTIVE:   SUBJECTIVE STATEMENT: S: "I did a lot of moving over the weekend so I'm a little sore."   PERTINENT HISTORY: Pt is a 47 y/o  female s/p left shoulder arthroscopy with subacromial decompression, distal clavicle excision, and bicep tenodesis on 09/24/22. Pt presents in Don Joy abduction sling, reports she has been moving her hand and wrist, has not taken sling off except for bathing/dressing.   PRECAUTIONS: Shoulder See protocol.   WEIGHT BEARING RESTRICTIONS: Yes NWB  PAIN:  Are you having pain? Yes: NPRS scale: 1/10 Pain location: deltoid and biceps Pain description: Pulling Aggravating factors: exercises Relieving factors: tylenol  FALLS: Has patient fallen in last 6 months? No  PLOF: Independent  PATIENT GOALS: To be able to use the LUE  NEXT MD VISIT: 11/18/22  OBJECTIVE:   HAND DOMINANCE: Right  ADLs: Overall ADLs: Pt is unable to use the LUE for any ADLs, is sleeping in the recliner. 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    FUNCTIONAL OUTCOME MEASURES: FOTO: 13/100 11/05/22: 49.69/100   UPPER EXTREMITY ROM:       Assessed supine, er/IR adducted  Passive ROM Left eval Left 10/23/22 Left 11/05/22  Shoulder flexion 124 152 159  Shoulder abduction 141 175 176  Shoulder internal rotation 90 90 90  Shoulder external rotation 52 50 56  (Blank rows = not tested)    Assessed in supine, er/IR adducted Active ROM Left 11/12/22  Shoulder flexion  167  Shoulder abduction 175  Shoulder internal rotation 90  Shoulder external rotation 73  (Blank rows = not tested)  UPPER EXTREMITY MMT:         MMT Left eval  Shoulder flexion   Shoulder abduction   Shoulder internal rotation   Shoulder external rotation   (Blank rows = not tested)  SENSATION: Occasional tingling/numbness in hand/fingers  OBSERVATIONS: Min/mod fascial restrictions along anterior shoulder, trapezius, and scapular regions   TODAY'S TREATMENT:                                                                                                                              DATE:     11/12/22 -Manual Therapy: myofascial release and trigger point applied to biceps, trapezius, scapular region and axillary region in order to reduce pain and fascial restrictions to improve ROM. -AA/ROM: supine, 2lb dowel, protraction, flexion, horizontal abduction, er/IR, abduction, x15  -A/ROM: supine, flexion, abduction, protraction, horizontal abduction, er/IR, x10 -Measurements for Progress Note -Proximal shoulder exercise: paddles, criss cross, circles both directions, x10 -[facility]: flexion, abduction, x10 -UBE: level 2, 3.5+, 3' forwards and backwards  11/10/22 -Manual Therapy: myofascial release and trigger point applied to biceps, trapezius, scapular region and axillary region in order to reduce pain and fascial restrictions to improve ROM. -AA/ROM: supine, protraction, flexion, horizontal abduction, er/IR, abduction, x15  -A/ROM: supine, flexion, abduction, protraction, horizontal abduction, er/IR, x8 -Scapular Strengthening: red band, extension, retraction, rows, x10 -Wall Slides: flexion, abduction, x10  11/05/22 -Manual Therapy: myofascial release and trigger point applied to biceps, trapezius, scapular region and axillary region in order to reduce pain and fascial restrictions to improve ROM.  -P/ROM: flexion, abduction, er/IR, horizontal abduction, 5 reps -AA/ROM: supine, 2lb dowel, protraction, flexion, horizontal abduction, er/IR, abduction, x15 -Isometrics: flexion, abduction, er, extension, 5x15" -Measurements for reassessment    PATIENT EDUCATION: Education details: Continue HEP Person educated: Patient Education method: Explanation, Demonstration, and Handouts Education comprehension: verbalized understanding and returned demonstration  HOME EXERCISE PROGRAM: Eval: pendulums, table slides, wrist/forearm A/ROM, elbow AA/ROM\ 5/17: cervical ROM 5/23: Scapular ROM 6/14: AA/ROM-supine only  6/19: Isometrics 6/24: Scapular  strengthening  GOALS: Goals reviewed with patient? Yes  SHORT TERM GOALS: Target date: 10/30/22  Pt will be provided with and educated on HEP to improve mobility in LUE required for use during ADL completion.   Goal status: IN PROGRESS  2.  Pt will increase LUE P/ROM by 30 degrees or greater to improve ability to use LUE during dressing tasks with minimal compensatory techniques.   Goal status: IN PROGRESS  3.  Pt will increase LUE strength to 3+/5 to improve ability to reach for items at waist to chest height during bathing and grooming tasks.   Goal status: IN PROGRESS    LONG TERM GOALS: Target date: 11/30/22  Pt will decrease pain in LUE to 3/10 or less to improve ability to sleep for 2+ consecutive hours without waking  due to pain.   Goal status: IN PROGRESS  2.  Pt will decrease LUE fascial restrictions to trace amounts or less to improve mobility required for functional reaching tasks.   Goal status: IN PROGRESS  3.  Pt will increase LUE A/ROM to 145 degrees or greater for flexion and abduction, and 50 degrees or greater for er, to improve ability to use LUE when reaching overhead or behind back during dressing and bathing tasks.   Goal status: IN PROGRESS  4.  Pt will increase LUE strength to 4+/5 or greater to improve ability to use LUE when lifting or carrying items during meal preparation/housework/yardwork tasks.   Goal status: IN PROGRESS  5.  Pt will return to highest level of function using LUE as non-dominant during functional task completion.   Goal status: IN PROGRESS  ASSESSMENT:  CLINICAL IMPRESSION: This session pt completed measurements for her progress note. She is demonstrating full ROM in supine with no pain noted, pt reporting mild pulling along the subscapular and deltoid. Her strength is improving well, as well as tolerance/endurance with all exercises. OT started pt on endurance based exercises this session with the UBE and "arms on fire" task,  both of which she tolerated well. Verbal and visual cuing provided for positioning and technique throughout session.     PERFORMANCE DEFICITS: in functional skills including ADLs, IADLs, ROM, strength, pain, fascial restrictions, and UE functional use   PLAN:  OT FREQUENCY: 2x/week  OT DURATION: 8 weeks  PLANNED INTERVENTIONS: self care/ADL training, therapeutic exercise, therapeutic activity, manual therapy, scar mobilization, passive range of motion, splinting, electrical stimulation, ultrasound, patient/family education, and DME and/or AE instructions  CONSULTED AND AGREED WITH PLAN OF CARE: Patient  PLAN FOR NEXT SESSION: fully transition to phase II-complete AA/ROM in supine and standing, add scapular theraband, proximal shoulder strengthening, Isometrics   Trish Mage, OTR/L [phone] 11/12/2022, 9:47 AM

## 2022-11-17 ENCOUNTER — Encounter (HOSPITAL_COMMUNITY): Payer: Self-pay | Admitting: Occupational Therapy

## 2022-11-17 ENCOUNTER — Ambulatory Visit (HOSPITAL_COMMUNITY): Payer: No Typology Code available for payment source | Attending: Orthopaedic Surgery | Admitting: Occupational Therapy

## 2022-11-17 DIAGNOSIS — M25612 Stiffness of left shoulder, not elsewhere classified: Secondary | ICD-10-CM | POA: Insufficient documentation

## 2022-11-17 DIAGNOSIS — R29898 Other symptoms and signs involving the musculoskeletal system: Secondary | ICD-10-CM | POA: Diagnosis present

## 2022-11-17 DIAGNOSIS — M25512 Pain in left shoulder: Secondary | ICD-10-CM | POA: Insufficient documentation

## 2022-11-17 NOTE — Therapy (Signed)
OUTPATIENT OCCUPATIONAL THERAPY ORTHO TREATMENT SESSION   Patient Name: Lori Duncan MRN: [id number] DOB:July 27, 1975, 47 y.o., female Today's Date: 11/17/2022  PCP: Dr. Assunta Found REFERRING PROVIDER: Dr. Ramond Marrow  END OF SESSION:  OT End of Session - 11/17/22 0859     Visit Number 11    Number of Visits 16    Date for OT Re-Evaluation 11/29/22    Authorization Type [employer]    Authorization - Visit Number 10    Authorization - Number of Visits 60    Progress Note Due on Visit 20    OT Start Time 0817    OT Stop Time 0859    OT Time Calculation (min) 42 min    Activity Tolerance Patient tolerated treatment well    Behavior During Therapy [facility] for tasks assessed/performed             Past Medical History:  Diagnosis Date   Asthma    Chlamydia 10/2009   LGSIL (low grade squamous intraepithelial dysplasia) 11/2010   Migraines    Smoker    STD (sexually transmitted disease)    Chlamydia   Past Surgical History:  Procedure Laterality Date   CERVICAL BIOPSY  W/ LOOP ELECTRODE EXCISION     COLPOSCOPY  11/2010   INTRAUTERINE DEVICE INSERTION  12/2009   LEEP  2002   SHOULDER SURGERY Right    TONSILLECTOMY AND ADENOIDECTOMY     Patient Active Problem List   Diagnosis Date Noted   Smoker    Dysplasia of cervix     ONSET DATE: 09/24/22  REFERRING DIAG: LEFT SHOULDER ARTHROSCOPY WITH SUBACROMIAL DECOMPRESSION,DISTAL CLAVICLE EXCISION, BICEPS TENODESIS   THERAPY DIAG:  Other symptoms and signs involving the musculoskeletal system  Acute pain of left shoulder  Stiffness of left shoulder, not elsewhere classified  Rationale for Evaluation and Treatment: Rehabilitation  SUBJECTIVE:   SUBJECTIVE STATEMENT: S: "Sleeping and everything has been doing well."   PERTINENT HISTORY: Pt is a 47 y/o female s/p left shoulder arthroscopy with subacromial decompression, distal clavicle excision, and bicep tenodesis on 09/24/22. Pt presents in Don Joy abduction sling,  reports she has been moving her hand and wrist, has not taken sling off except for bathing/dressing.   PRECAUTIONS: Shoulder See protocol.   WEIGHT BEARING RESTRICTIONS: Yes NWB  PAIN:  Are you having pain? No  FALLS: Has patient fallen in last 6 months? No  PLOF: Independent  PATIENT GOALS: To be able to use the LUE  NEXT MD VISIT: 11/18/22  OBJECTIVE:   HAND DOMINANCE: Right  ADLs: Overall ADLs: Pt is unable to use the LUE for any ADLs, is sleeping in the recliner. 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    FUNCTIONAL OUTCOME MEASURES: FOTO: 13/100 11/05/22: 49.69/100   UPPER EXTREMITY ROM:       Assessed supine, er/IR adducted  Passive ROM Left eval Left 10/23/22 Left 11/05/22  Shoulder flexion 124 152 159  Shoulder abduction 141 175 176  Shoulder internal rotation 90 90 90  Shoulder external rotation 52 50 56  (Blank rows = not tested)    Assessed in supine, er/IR adducted Active ROM Left 11/12/22  Shoulder flexion 167  Shoulder abduction 175  Shoulder internal rotation 90  Shoulder external rotation 73  (Blank rows = not tested)  UPPER EXTREMITY MMT:         MMT Left eval  Shoulder flexion   Shoulder abduction   Shoulder internal  rotation   Shoulder external rotation   (Blank rows = not tested)  SENSATION: Occasional tingling/numbness in hand/fingers  OBSERVATIONS: Min/mod fascial restrictions along anterior shoulder, trapezius, and scapular regions   TODAY'S TREATMENT:                                                                                                                              DATE:    11/17/22 -Manual Therapy: myofascial release and trigger point applied to biceps, trapezius, scapular region and axillary region in order to reduce pain and fascial restrictions to improve ROM. -A/ROM: supine, flexion, abduction, protraction, horizontal abduction, er/IR, x12 -Proximal shoulder strengthening:  1lb dumbbells, paddles, criss cross, circle both directions, x12 -Shoulder strengthening: 1lb dumbbell, flexion, abduction, protraction, horizontal abduction, er/IR, x10 -Overhead Lacing -Reaching: 10 cones from counter to shoulder height shelf to head height shelf, to overhead shelf, then back down -Scapular Strengthening: green band, extension, retraction, protraction, rows, x15 -UBE: Level 2, KPH 4.0+, 3' forwards and backwards  11/12/22 -Manual Therapy: myofascial release and trigger point applied to biceps, trapezius, scapular region and axillary region in order to reduce pain and fascial restrictions to improve ROM. -AA/ROM: supine, 2lb dowel, protraction, flexion, horizontal abduction, er/IR, abduction, x15  -A/ROM: supine, flexion, abduction, protraction, horizontal abduction, er/IR, x10 -Measurements for Progress Note -Proximal shoulder exercise: paddles, criss cross, circles both directions, x10 -[facility] [place]: flexion, abduction, x10 -UBE: level 2, 3.5+, 3' forwards and backwards  11/10/22 -Manual Therapy: myofascial release and trigger point applied to biceps, trapezius, scapular region and axillary region in order to reduce pain and fascial restrictions to improve ROM. -AA/ROM: supine, protraction, flexion, horizontal abduction, er/IR, abduction, x15  -A/ROM: supine, flexion, abduction, protraction, horizontal abduction, er/IR, x8 -Scapular Strengthening: red band, extension, retraction, rows, x10 -Wall Slides: flexion, abduction, x10   PATIENT EDUCATION: Education details: Continue HEP Person educated: Patient Education method: Programmer, multimedia, Demonstration, and Handouts Education comprehension: verbalized understanding and returned demonstration  HOME EXERCISE PROGRAM: Eval: pendulums, table slides, wrist/forearm A/ROM, elbow AA/ROM\ 5/17: cervical ROM 5/23: Scapular ROM 6/14: AA/ROM-supine only  6/19: Isometrics 6/24: Scapular strengthening  GOALS: Goals  reviewed with patient? Yes  SHORT TERM GOALS: Target date: 10/30/22  Pt will be provided with and educated on HEP to improve mobility in LUE required for use during ADL completion.   Goal status: IN PROGRESS  2.  Pt will increase LUE P/ROM by 30 degrees or greater to improve ability to use LUE during dressing tasks with minimal compensatory techniques.   Goal status: IN PROGRESS  3.  Pt will increase LUE strength to 3+/5 to improve ability to reach for items at waist to chest height during bathing and grooming tasks.   Goal status: IN PROGRESS    LONG TERM GOALS: Target date: 11/30/22  Pt will decrease pain in LUE to 3/10 or less to improve ability to sleep for 2+ consecutive hours without waking due to pain.   Goal  status: IN PROGRESS  2.  Pt will decrease LUE fascial restrictions to trace amounts or less to improve mobility required for functional reaching tasks.   Goal status: IN PROGRESS  3.  Pt will increase LUE A/ROM to 145 degrees or greater for flexion and abduction, and 50 degrees or greater for er, to improve ability to use LUE when reaching overhead or behind back during dressing and bathing tasks.   Goal status: IN PROGRESS  4.  Pt will increase LUE strength to 4+/5 or greater to improve ability to use LUE when lifting or carrying items during meal preparation/housework/yardwork tasks.   Goal status: IN PROGRESS  5.  Pt will return to highest level of function using LUE as non-dominant during functional task completion.   Goal status: IN PROGRESS  ASSESSMENT:  CLINICAL IMPRESSION: Pt presenting to therapy with no pain and good ROM/movement pattern. She is reaching approximately 90% of full A/ROM in sitting, and when adding 1 lb dumbbell she demonstrated mild fatigue but was able to complete all exercises. With endurance based tasks this session she reported mild fatigue but did not require a rest break. OT providing verbal and visual cuing for positioning and  technique throughout session.    PERFORMANCE DEFICITS: in functional skills including ADLs, IADLs, ROM, strength, pain, fascial restrictions, and UE functional use   PLAN:  OT FREQUENCY: 2x/week  OT DURATION: 8 weeks  PLANNED INTERVENTIONS: self care/ADL training, therapeutic exercise, therapeutic activity, manual therapy, scar mobilization, passive range of motion, splinting, electrical stimulation, ultrasound, patient/family education, and DME and/or AE instructions  CONSULTED AND AGREED WITH PLAN OF CARE: Patient  PLAN FOR NEXT SESSION: fully transition to phase II-complete AA/ROM in supine and standing, add scapular theraband, proximal shoulder strengthening, Isometrics, Follow up on MD visit   Trish Mage, OTR/L [phone] 11/17/2022, 9:00 AM

## 2022-11-19 ENCOUNTER — Ambulatory Visit (HOSPITAL_COMMUNITY): Payer: No Typology Code available for payment source | Admitting: Occupational Therapy

## 2022-11-19 DIAGNOSIS — M25512 Pain in left shoulder: Secondary | ICD-10-CM

## 2022-11-19 DIAGNOSIS — M25612 Stiffness of left shoulder, not elsewhere classified: Secondary | ICD-10-CM

## 2022-11-19 DIAGNOSIS — R29898 Other symptoms and signs involving the musculoskeletal system: Secondary | ICD-10-CM

## 2022-11-19 NOTE — Patient Instructions (Addendum)
Complete _______ repetitions each. Complete _______ time a day.  Wall taps with theraband  With a looped elastic band around forearms/wrists and arms at a 90 angle pressed against the wall. Keeping elbows on the wall, tap right arm out to the right. Hold for 1 second. Return right arm back to neutral. Repeat with left arm.    Wall V slides with Theraband  Place a band loop around hands/forearms and face a wall. Extend both arms diagonally into a V shape on the wall. Hold this stretch for specified amount of time. Lower arms slowly back into neutral position. Repeat.       scap clocks  Tie a loop with a theraband and place around your wrists.  Stand with a wall in front of you.  Picture a clock in front of you.  Place both palms on the wall, arms straight.  While keeping the left/right hand planted, use the right/left hand to pull away and tap each number (1, 3, 5- right OR 11,9,7 left), coming back to center each time.      Theraband strengthening: Complete 10-15X, 1-2X/day  1) Shoulder protraction  Anchor band in doorway, stand with back to door. Push your hand forward as much as you can to bringing your shoulder blades forward on your rib cage.      2) Shoulder horizontal abduction  Standing with a theraband anchored at chest height, begin with arm straight and some tension in the band. Move your arm out to your side (keeping straight the whole time). Bring the affected arm back to midline.     3) Shoulder Internal Rotation  While holding an elastic band at your side with your elbow bent, start with your hand away from your stomach, then pull the band towards your stomach. Keep your elbow near your side the entire time.     4) Shoulder External Rotation  While holding an elastic band at your side with your elbow bent, start with your hand near your stomach and then pull the band away. Keep your elbow at your side the entire time.     5) Shoulder flexion  While  standing with back to the door, holding Theraband at hand level, raise arm in front of you.  Keep elbow straight through entire movement.      6) Shoulder abduction  While holding an elastic band at your side, draw up your arm to the side keeping your elbow straight.   

## 2022-11-19 NOTE — Therapy (Signed)
OUTPATIENT OCCUPATIONAL THERAPY ORTHO TREATMENT SESSION   Patient Name: Lori Duncan MRN: [id number] DOB:Sep 01, 1975, 47 y.o., female Today's Date: 11/19/2022  PCP: Dr. Assunta Found REFERRING PROVIDER: Dr. Ramond Marrow  END OF SESSION:  OT End of Session - 11/19/22 1217     Visit Number 12    Number of Visits 16    Date for OT Re-Evaluation 12/26/22    Authorization Type [employer]    Authorization - Visit Number 11    Authorization - Number of Visits 60    Progress Note Due on Visit 20    OT Start Time 0904    OT Stop Time 0950    OT Time Calculation (min) 46 min    Activity Tolerance Patient tolerated treatment well    Behavior During Therapy [facility] for tasks assessed/performed              Past Medical History:  Diagnosis Date   Asthma    Chlamydia 10/2009   LGSIL (low grade squamous intraepithelial dysplasia) 11/2010   Migraines    Smoker    STD (sexually transmitted disease)    Chlamydia   Past Surgical History:  Procedure Laterality Date   CERVICAL BIOPSY  W/ LOOP ELECTRODE EXCISION     COLPOSCOPY  11/2010   INTRAUTERINE DEVICE INSERTION  12/2009   LEEP  2002   SHOULDER SURGERY Right    TONSILLECTOMY AND ADENOIDECTOMY     Patient Active Problem List   Diagnosis Date Noted   Smoker    Dysplasia of cervix     ONSET DATE: 09/24/22  REFERRING DIAG: LEFT SHOULDER ARTHROSCOPY WITH SUBACROMIAL DECOMPRESSION,DISTAL CLAVICLE EXCISION, BICEPS TENODESIS   THERAPY DIAG:  Other symptoms and signs involving the musculoskeletal system  Acute pain of left shoulder  Stiffness of left shoulder, not elsewhere classified  Rationale for Evaluation and Treatment: Rehabilitation  SUBJECTIVE:   SUBJECTIVE STATEMENT: S: "The doctor completely released me!"   PERTINENT HISTORY: Pt is a 47 y/o female s/p left shoulder arthroscopy with subacromial decompression, distal clavicle excision, and bicep tenodesis on 09/24/22. Pt presents in Don Joy abduction sling, reports she  has been moving her hand and wrist, has not taken sling off except for bathing/dressing.   PRECAUTIONS: Shoulder See protocol.   WEIGHT BEARING RESTRICTIONS: Yes NWB  PAIN:  Are you having pain? No  FALLS: Has patient fallen in last 6 months? No  PLOF: Independent  PATIENT GOALS: To be able to use the LUE  NEXT MD VISIT: 11/18/22  OBJECTIVE:   HAND DOMINANCE: Right  ADLs: Overall ADLs: Pt is unable to use the LUE for any ADLs, is sleeping in the recliner. 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    FUNCTIONAL OUTCOME MEASURES: FOTO: 13/100 11/05/22: 49.69/100   UPPER EXTREMITY ROM:       Assessed supine, er/IR adducted  Passive ROM Left eval Left 10/23/22 Left 11/05/22  Shoulder flexion 124 152 159  Shoulder abduction 141 175 176  Shoulder internal rotation 90 90 90  Shoulder external rotation 52 50 56  (Blank rows = not tested)    Assessed in supine, er/IR adducted Active ROM Left 11/12/22  Shoulder flexion 167  Shoulder abduction 175  Shoulder internal rotation 90  Shoulder external rotation 73  (Blank rows = not tested)  UPPER EXTREMITY MMT:         MMT Left eval  Shoulder flexion   Shoulder abduction   Shoulder internal rotation  Shoulder external rotation   (Blank rows = not tested)  SENSATION: Occasional tingling/numbness in hand/fingers  OBSERVATIONS: Min/mod fascial restrictions along anterior shoulder, trapezius, and scapular regions   TODAY'S TREATMENT:                                                                                                                              DATE:    11/19/22 -Manual Therapy: myofascial release and trigger point applied to biceps, trapezius, scapular region and axillary region in order to reduce pain and fascial restrictions to improve ROM. -A/ROM: seated, 1lb dumbbell, flexion, abduction, protraction, horizontal abduction, er/IR, x15 -Proximal shoulder strengthening:  1lb dumbbells, paddles, criss cross, circle both directions, x15 -Loop Band Exercises: green band, wall taps, V ups, Wall Clocks, x10 -Scapular Strengthening: green band, extension, retraction, protraction, rows, x15 -Shoulder strengthening: green band, horizontal abduction, er, IR, flexion, abduction, x10 -x to v arms x12, 1lb dumbbell -goal post arms x12, 1lb dumbbell  11/17/22 -Manual Therapy: myofascial release and trigger point applied to biceps, trapezius, scapular region and axillary region in order to reduce pain and fascial restrictions to improve ROM. -A/ROM: supine, flexion, abduction, protraction, horizontal abduction, er/IR, x12 -Proximal shoulder strengthening: 1lb dumbbells, paddles, criss cross, circle both directions, x12 -Shoulder strengthening: 1lb dumbbell, flexion, abduction, protraction, horizontal abduction, er/IR, x10 -Overhead Lacing -Reaching: 10 cones from counter to shoulder height shelf to head height shelf, to overhead shelf, then back down -Scapular Strengthening: green band, extension, retraction, protraction, rows, x15 -UBE: Level 2, KPH 4.0+, 3' forwards and backwards  11/12/22 -Manual Therapy: myofascial release and trigger point applied to biceps, trapezius, scapular region and axillary region in order to reduce pain and fascial restrictions to improve ROM. -AA/ROM: supine, 2lb dowel, protraction, flexion, horizontal abduction, er/IR, abduction, x15  -A/ROM: supine, flexion, abduction, protraction, horizontal abduction, er/IR, x10 -Measurements for Progress Note -Proximal shoulder exercise: paddles, criss cross, circles both directions, x10 -[facility] [place]: flexion, abduction, x10 -UBE: level 2, 3.5+, 3' forwards and backwards   PATIENT EDUCATION: Education details: Loop Band Exercises and shoulder strengthening Person educated: Patient Education method: Explanation, Demonstration, and Handouts Education comprehension: verbalized understanding  and returned demonstration  HOME EXERCISE PROGRAM: Eval: pendulums, table slides, wrist/forearm A/ROM, elbow AA/ROM\ 5/17: cervical ROM 5/23: Scapular ROM 6/14: AA/ROM-supine only  6/19: Isometrics 6/24: Scapular strengthening 7/3: Loop Band Exercises and shoulder strengthening  GOALS: Goals reviewed with patient? Yes  SHORT TERM GOALS: Target date: 10/30/22  Pt will be provided with and educated on HEP to improve mobility in LUE required for use during ADL completion.   Goal status: IN PROGRESS  2.  Pt will increase LUE P/ROM by 30 degrees or greater to improve ability to use LUE during dressing tasks with minimal compensatory techniques.   Goal status: IN PROGRESS  3.  Pt will increase LUE strength to 3+/5 to improve ability to reach for items at waist to chest height during bathing and grooming  tasks.   Goal status: IN PROGRESS    LONG TERM GOALS: Target date: 11/30/22  Pt will decrease pain in LUE to 3/10 or less to improve ability to sleep for 2+ consecutive hours without waking due to pain.   Goal status: IN PROGRESS  2.  Pt will decrease LUE fascial restrictions to trace amounts or less to improve mobility required for functional reaching tasks.   Goal status: IN PROGRESS  3.  Pt will increase LUE A/ROM to 145 degrees or greater for flexion and abduction, and 50 degrees or greater for er, to improve ability to use LUE when reaching overhead or behind back during dressing and bathing tasks.   Goal status: IN PROGRESS  4.  Pt will increase LUE strength to 4+/5 or greater to improve ability to use LUE when lifting or carrying items during meal preparation/housework/yardwork tasks.   Goal status: IN PROGRESS  5.  Pt will return to highest level of function using LUE as non-dominant during functional task completion.   Goal status: IN PROGRESS  ASSESSMENT:  CLINICAL IMPRESSION: Pt was released by her doctor to work on strengthening and move on into the final  phases of the protocol. This session she started overall shoulder strengthening with resistance bands, reporting increased difficulty with flexion and abduction, as she was starting to have muscle fatigue. OT providing verbal and tactile cuing throughout session for positioning and technique.   PERFORMANCE DEFICITS: in functional skills including ADLs, IADLs, ROM, strength, pain, fascial restrictions, and UE functional use   PLAN:  OT FREQUENCY: 2x/week  OT DURATION: 8 weeks  PLANNED INTERVENTIONS: self care/ADL training, therapeutic exercise, therapeutic activity, manual therapy, scar mobilization, passive range of motion, splinting, electrical stimulation, ultrasound, patient/family education, and DME and/or AE instructions  CONSULTED AND AGREED WITH PLAN OF CARE: Patient  PLAN FOR NEXT SESSION: fully transition to phase II-complete AA/ROM in supine and standing, add scapular theraband, proximal shoulder strengthening, Isometrics, Follow up on MD visit   Trish Mage, OTR/L [phone] 11/19/2022, 12:20 PM

## 2022-11-24 ENCOUNTER — Ambulatory Visit (HOSPITAL_COMMUNITY): Payer: No Typology Code available for payment source | Admitting: Occupational Therapy

## 2022-11-24 ENCOUNTER — Encounter (HOSPITAL_COMMUNITY): Payer: Self-pay | Admitting: Occupational Therapy

## 2022-11-24 DIAGNOSIS — R29898 Other symptoms and signs involving the musculoskeletal system: Secondary | ICD-10-CM | POA: Diagnosis not present

## 2022-11-24 DIAGNOSIS — M25512 Pain in left shoulder: Secondary | ICD-10-CM

## 2022-11-24 DIAGNOSIS — M25612 Stiffness of left shoulder, not elsewhere classified: Secondary | ICD-10-CM

## 2022-11-24 NOTE — Therapy (Signed)
OUTPATIENT OCCUPATIONAL THERAPY ORTHO TREATMENT SESSION   Patient Name: Lori Duncan MRN: [id number] DOB:December 26, 1975, 47 y.o., female Today's Date: 11/24/2022  PCP: Dr. Assunta Found REFERRING PROVIDER: Dr. Ramond Marrow  END OF SESSION:  OT End of Session - 11/24/22 0921     Visit Number 13    Number of Visits 16    Date for OT Re-Evaluation 12/26/22    Authorization Type [employer]    Authorization - Visit Number 12    Authorization - Number of Visits 60    Progress Note Due on Visit 20    OT Start Time 0817    OT Stop Time 0857    OT Time Calculation (min) 40 min    Activity Tolerance Patient tolerated treatment well    Behavior During Therapy [facility] for tasks assessed/performed             Past Medical History:  Diagnosis Date   Asthma    Chlamydia 10/2009   LGSIL (low grade squamous intraepithelial dysplasia) 11/2010   Migraines    Smoker    STD (sexually transmitted disease)    Chlamydia   Past Surgical History:  Procedure Laterality Date   CERVICAL BIOPSY  W/ LOOP ELECTRODE EXCISION     COLPOSCOPY  11/2010   INTRAUTERINE DEVICE INSERTION  12/2009   LEEP  2002   SHOULDER SURGERY Right    TONSILLECTOMY AND ADENOIDECTOMY     Patient Active Problem List   Diagnosis Date Noted   Smoker    Dysplasia of cervix     ONSET DATE: 09/24/22  REFERRING DIAG: LEFT SHOULDER ARTHROSCOPY WITH SUBACROMIAL DECOMPRESSION,DISTAL CLAVICLE EXCISION, BICEPS TENODESIS   THERAPY DIAG:  Other symptoms and signs involving the musculoskeletal system  Acute pain of left shoulder  Stiffness of left shoulder, not elsewhere classified  Rationale for Evaluation and Treatment: Rehabilitation  SUBJECTIVE:   SUBJECTIVE STATEMENT: S: "I've been really sore and tight the past few days."   PERTINENT HISTORY: Pt is a 48 y/o female s/p left shoulder arthroscopy with subacromial decompression, distal clavicle excision, and bicep tenodesis on 09/24/22. Pt presents in Don Joy abduction  sling, reports she has been moving her hand and wrist, has not taken sling off except for bathing/dressing.   PRECAUTIONS: Shoulder See protocol.   WEIGHT BEARING RESTRICTIONS: Yes NWB  PAIN:  Are you having pain? No  FALLS: Has patient fallen in last 6 months? No  PLOF: Independent  PATIENT GOALS: To be able to use the LUE  NEXT MD VISIT: 11/18/22  OBJECTIVE:   HAND DOMINANCE: Right  ADLs: Overall ADLs: Pt is unable to use the LUE for any ADLs, is sleeping in the recliner. 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    FUNCTIONAL OUTCOME MEASURES: FOTO: 13/100 11/05/22: 49.69/100   UPPER EXTREMITY ROM:       Assessed supine, er/IR adducted  Passive ROM Left eval Left 10/23/22 Left 11/05/22  Shoulder flexion 124 152 159  Shoulder abduction 141 175 176  Shoulder internal rotation 90 90 90  Shoulder external rotation 52 50 56  (Blank rows = not tested)    Assessed in supine, er/IR adducted Active ROM Left 11/12/22  Shoulder flexion 167  Shoulder abduction 175  Shoulder internal rotation 90  Shoulder external rotation 73  (Blank rows = not tested)  UPPER EXTREMITY MMT:         MMT Left eval  Shoulder flexion   Shoulder abduction  Shoulder internal rotation   Shoulder external rotation   (Blank rows = not tested)  SENSATION: Occasional tingling/numbness in hand/fingers  OBSERVATIONS: Min/mod fascial restrictions along anterior shoulder, trapezius, and scapular regions   TODAY'S TREATMENT:                                                                                                                              DATE:     11/24/22 -Manual Therapy: myofascial release and trigger point applied to biceps, trapezius, scapular region and axillary region in order to reduce pain and fascial restrictions to improve ROM. -A/ROM: seated, flexion, abduction, protraction, horizontal abduction, er/IR, x15 -PNF Strengthening: green  band, chest pulls, head level pulls, er pulls, PNF up, PNF down, x12 -Shoulder strengthening: green band, horizontal abduction, er, IR, flexion, abduction, x15 -Scapular Strengthening: green band, extension, retraction, protraction, rows, x15 -[facility] on the wall: flexion, abduction, x10 -Therapy Ball Exercises: flexion, protraction, overhead press, x12  11/19/22 -Manual Therapy: myofascial release and trigger point applied to biceps, trapezius, scapular region and axillary region in order to reduce pain and fascial restrictions to improve ROM. -A/ROM: seated, 1lb dumbbell, flexion, abduction, protraction, horizontal abduction, er/IR, x15 -Proximal shoulder strengthening: 1lb dumbbells, paddles, criss cross, circle both directions, x15 -Loop Band Exercises: green band, wall taps, V ups, Wall Clocks, x10 -Scapular Strengthening: green band, extension, retraction, protraction, rows, x15 -Shoulder strengthening: green band, horizontal abduction, er, IR, flexion, abduction, x10 -x to v arms x12, 1lb dumbbell -goal post arms x12, 1lb dumbbell  11/17/22 -Manual Therapy: myofascial release and trigger point applied to biceps, trapezius, scapular region and axillary region in order to reduce pain and fascial restrictions to improve ROM. -A/ROM: supine, flexion, abduction, protraction, horizontal abduction, er/IR, x12 -Proximal shoulder strengthening: 1lb dumbbells, paddles, criss cross, circle both directions, x12 -Shoulder strengthening: 1lb dumbbell, flexion, abduction, protraction, horizontal abduction, er/IR, x10 -Overhead Lacing -Reaching: 10 cones from counter to shoulder height shelf to head height shelf, to overhead shelf, then back down -Scapular Strengthening: green band, extension, retraction, protraction, rows, x15 -UBE: Level 2, KPH 4.0+, 3' forwards and backwards   PATIENT EDUCATION: Education details: PNF strengthening Person educated: Patient Education method: Explanation,  Demonstration, and Handouts Education comprehension: verbalized understanding and returned demonstration  HOME EXERCISE PROGRAM: Eval: pendulums, table slides, wrist/forearm A/ROM, elbow AA/ROM\ 5/17: cervical ROM 5/23: Scapular ROM 6/14: AA/ROM-supine only  6/19: Isometrics 6/24: Scapular strengthening 7/3: Loop Band Exercises and shoulder strengthening 7/8: PNF Strengthening  GOALS: Goals reviewed with patient? Yes  SHORT TERM GOALS: Target date: 10/30/22  Pt will be provided with and educated on HEP to improve mobility in LUE required for use during ADL completion.   Goal status: IN PROGRESS  2.  Pt will increase LUE P/ROM by 30 degrees or greater to improve ability to use LUE during dressing tasks with minimal compensatory techniques.   Goal status: IN PROGRESS  3.  Pt will increase LUE strength to 3+/5 to  improve ability to reach for items at waist to chest height during bathing and grooming tasks.   Goal status: IN PROGRESS    LONG TERM GOALS: Target date: 11/30/22  Pt will decrease pain in LUE to 3/10 or less to improve ability to sleep for 2+ consecutive hours without waking due to pain.   Goal status: IN PROGRESS  2.  Pt will decrease LUE fascial restrictions to trace amounts or less to improve mobility required for functional reaching tasks.   Goal status: IN PROGRESS  3.  Pt will increase LUE A/ROM to 145 degrees or greater for flexion and abduction, and 50 degrees or greater for er, to improve ability to use LUE when reaching overhead or behind back during dressing and bathing tasks.   Goal status: IN PROGRESS  4.  Pt will increase LUE strength to 4+/5 or greater to improve ability to use LUE when lifting or carrying items during meal preparation/housework/yardwork tasks.   Goal status: IN PROGRESS  5.  Pt will return to highest level of function using LUE as non-dominant during functional task completion.   Goal status: IN  PROGRESS  ASSESSMENT:  CLINICAL IMPRESSION: This session, pt continuing to do well with adding strengthening exercises. She continues to have increased difficulty with flexion and abduction against resistance and demonstrates increased fatigue during these tasks. She is able to take a short rest break then return to complete those tasks. Verbal and visual cuing provided throughout session for positioning and technique.    PERFORMANCE DEFICITS: in functional skills including ADLs, IADLs, ROM, strength, pain, fascial restrictions, and UE functional use   PLAN:  OT FREQUENCY: 2x/week  OT DURATION: 8 weeks  PLANNED INTERVENTIONS: self care/ADL training, therapeutic exercise, therapeutic activity, manual therapy, scar mobilization, passive range of motion, splinting, electrical stimulation, ultrasound, patient/family education, and DME and/or AE instructions  CONSULTED AND AGREED WITH PLAN OF CARE: Patient  PLAN FOR NEXT SESSION: A/ROM, Manual as needed, Scap strengthening, shoulder strengthening, Loopband exercises, Theraball exercises, Endurance based tasks   Trish Mage, OTR/L [phone] 11/24/2022, 9:21 AM

## 2022-11-24 NOTE — Patient Instructions (Signed)

## 2022-11-26 ENCOUNTER — Encounter (HOSPITAL_COMMUNITY): Payer: BC Managed Care – PPO | Admitting: Occupational Therapy

## 2022-12-09 ENCOUNTER — Ambulatory Visit (HOSPITAL_COMMUNITY): Payer: No Typology Code available for payment source | Admitting: Occupational Therapy

## 2022-12-12 ENCOUNTER — Encounter (HOSPITAL_COMMUNITY): Payer: No Typology Code available for payment source | Admitting: Occupational Therapy

## 2022-12-17 ENCOUNTER — Ambulatory Visit (HOSPITAL_COMMUNITY): Payer: No Typology Code available for payment source | Admitting: Occupational Therapy

## 2022-12-17 ENCOUNTER — Encounter (HOSPITAL_COMMUNITY): Payer: Self-pay | Admitting: Occupational Therapy

## 2022-12-17 DIAGNOSIS — R29898 Other symptoms and signs involving the musculoskeletal system: Secondary | ICD-10-CM | POA: Diagnosis not present

## 2022-12-17 DIAGNOSIS — M25612 Stiffness of left shoulder, not elsewhere classified: Secondary | ICD-10-CM

## 2022-12-17 DIAGNOSIS — M25512 Pain in left shoulder: Secondary | ICD-10-CM

## 2022-12-17 NOTE — Patient Instructions (Signed)

## 2022-12-17 NOTE — Therapy (Addendum)
OUTPATIENT OCCUPATIONAL THERAPY ORTHO TREATMENT SESSION   Patient Name: Lori Duncan MRN: [id number] DOB:July 22, 1975, 47 y.o., female Today's Date: 12/17/2022  PCP: Dr. Assunta Found REFERRING PROVIDER: Dr. Ramond Marrow  OCCUPATIONAL THERAPY DISCHARGE SUMMARY  Visits from Start of Care: 14  Discharge Summary Patient did not return to OT following this visit, despite multiple attempts to follow up. Pt will be discharged from OT.    \  END OF SESSION:  OT End of Session - 12/17/22 0857     Visit Number 14    Number of Visits 16    Date for OT Re-Evaluation 12/26/22    Authorization Type Aetna    Authorization - Visit Number 13    Authorization - Number of Visits 60    Progress Note Due on Visit 20    OT Start Time 0855    OT Stop Time 0935    OT Time Calculation (min) 40 min    Activity Tolerance Patient tolerated treatment well    Behavior During Therapy [facility] for tasks assessed/performed             Past Medical History:  Diagnosis Date   Asthma    Chlamydia 10/2009   LGSIL (low grade squamous intraepithelial dysplasia) 11/2010   Migraines    Smoker    STD (sexually transmitted disease)    Chlamydia   Past Surgical History:  Procedure Laterality Date   CERVICAL BIOPSY  W/ LOOP ELECTRODE EXCISION     COLPOSCOPY  11/2010   INTRAUTERINE DEVICE INSERTION  12/2009   LEEP  2002   SHOULDER SURGERY Right    TONSILLECTOMY AND ADENOIDECTOMY     Patient Active Problem List   Diagnosis Date Noted   Smoker    Dysplasia of cervix     ONSET DATE: 09/24/22  REFERRING DIAG: LEFT SHOULDER ARTHROSCOPY WITH SUBACROMIAL DECOMPRESSION,DISTAL CLAVICLE EXCISION, BICEPS TENODESIS   THERAPY DIAG:  Acute pain of left shoulder  Stiffness of left shoulder, not elsewhere classified  Other symptoms and signs involving the musculoskeletal system  Rationale for Evaluation and Treatment: Rehabilitation  SUBJECTIVE:   SUBJECTIVE STATEMENT: S: "I've been really sore and  tight the past few days."   PERTINENT HISTORY: Pt is a 47 y/o female s/p left shoulder arthroscopy with subacromial decompression, distal clavicle excision, and bicep tenodesis on 09/24/22. Pt presents in Don Joy abduction sling, reports she has been moving her hand and wrist, has not taken sling off except for bathing/dressing.   PRECAUTIONS: Shoulder See protocol.   WEIGHT BEARING RESTRICTIONS: Yes NWB  PAIN:  Are you having pain? No  FALLS: Has patient fallen in last 6 months? No  PLOF: Independent  PATIENT GOALS: To be able to use the LUE  NEXT MD VISIT: 11/18/22  OBJECTIVE:   HAND DOMINANCE: Right  ADLs: Overall ADLs: Pt is unable to use the LUE for any ADLs, is sleeping in the recliner. 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    FUNCTIONAL OUTCOME MEASURES: FOTO: 13/100 11/05/22: 49.69/100   UPPER EXTREMITY ROM:       Assessed supine, er/IR adducted  Passive ROM Left eval Left 10/23/22 Left 11/05/22  Shoulder flexion 124 152 159  Shoulder abduction 141 175 176  Shoulder internal rotation 90 90 90  Shoulder external rotation 52 50 56  (Blank rows = not tested)    Assessed in supine, er/IR adducted Active ROM Left 11/12/22  Shoulder flexion 167  Shoulder abduction  175  Shoulder internal rotation 90  Shoulder external rotation 73  (Blank rows = not tested)  UPPER EXTREMITY MMT:         MMT Left eval  Shoulder flexion   Shoulder abduction   Shoulder internal rotation   Shoulder external rotation   (Blank rows = not tested)  SENSATION: Occasional tingling/numbness in hand/fingers  OBSERVATIONS: Min/mod fascial restrictions along anterior shoulder, trapezius, and scapular regions   TODAY'S TREATMENT:                                                                                                                              DATE:     12/17/22 -A/ROM: seated, flexion, abduction, protraction, horizontal abduction,  er/IR, x15 -X to V arms, x15, 2lb dumbbells -Goal Post arms, x15, 2lb dumbbells -Shoulder Strengthening: 2lb dumbbells, flexion, abduction, protraction, horizontal abduction, er/IR, x15 -Therapy Ball Exercises: green therapy Ball, flexion, protraction, overhead press, V ups, circles both directions, x12 -Shoulder Strengthening: green band, flexion, abduction, er, ir, horizontal abduction, x10 -Overhead lacing  11/24/22 -Manual Therapy: myofascial release and trigger point applied to biceps, trapezius, scapular region and axillary region in order to reduce pain and fascial restrictions to improve ROM. -A/ROM: seated, flexion, abduction, protraction, horizontal abduction, er/IR, x15 -PNF Strengthening: green band, chest pulls, head level pulls, er pulls, PNF up, PNF down, x12 -Shoulder strengthening: green band, horizontal abduction, er, IR, flexion, abduction, x15 -Scapular Strengthening: green band, extension, retraction, protraction, rows, x15 -[facility] on the wall: flexion, abduction, x10 -Therapy Ball Exercises: flexion, protraction, overhead press, x12  11/19/22 -Manual Therapy: myofascial release and trigger point applied to biceps, trapezius, scapular region and axillary region in order to reduce pain and fascial restrictions to improve ROM. -A/ROM: seated, 1lb dumbbell, flexion, abduction, protraction, horizontal abduction, er/IR, x15 -Proximal shoulder strengthening: 1lb dumbbells, paddles, criss cross, circle both directions, x15 -Loop Band Exercises: green band, wall taps, V ups, Wall Clocks, x10 -Scapular Strengthening: green band, extension, retraction, protraction, rows, x15 -Shoulder strengthening: green band, horizontal abduction, er, IR, flexion, abduction, x10 -x to v arms x12, 1lb dumbbell -goal post arms x12, 1lb dumbbell   PATIENT EDUCATION: Education details: Therapy Ball Exercises Person educated: Patient Education method: Explanation, Demonstration, and  Handouts Education comprehension: verbalized understanding and returned demonstration  HOME EXERCISE PROGRAM: Eval: pendulums, table slides, wrist/forearm A/ROM, elbow AA/ROM\ 5/17: cervical ROM 5/23: Scapular ROM 6/14: AA/ROM-supine only  6/19: Isometrics 6/24: Scapular strengthening 7/3: Loop Band Exercises and shoulder strengthening 7/8: PNF Strengthening 7/31: Therapy Ball Exercises  GOALS: Goals reviewed with patient? Yes  SHORT TERM GOALS: Target date: 10/30/22  Pt will be provided with and educated on HEP to improve mobility in LUE required for use during ADL completion.   Goal status: IN PROGRESS  2.  Pt will increase LUE P/ROM by 30 degrees or greater to improve ability to use LUE during dressing tasks with minimal compensatory techniques.   Goal status: IN PROGRESS  3.  Pt will increase LUE strength to 3+/5 to improve ability to reach for items at waist to chest height during bathing and grooming tasks.   Goal status: IN PROGRESS    LONG TERM GOALS: Target date: 11/30/22  Pt will decrease pain in LUE to 3/10 or less to improve ability to sleep for 2+ consecutive hours without waking due to pain.   Goal status: IN PROGRESS  2.  Pt will decrease LUE fascial restrictions to trace amounts or less to improve mobility required for functional reaching tasks.   Goal status: IN PROGRESS  3.  Pt will increase LUE A/ROM to 145 degrees or greater for flexion and abduction, and 50 degrees or greater for er, to improve ability to use LUE when reaching overhead or behind back during dressing and bathing tasks.   Goal status: IN PROGRESS  4.  Pt will increase LUE strength to 4+/5 or greater to improve ability to use LUE when lifting or carrying items during meal preparation/housework/yardwork tasks.   Goal status: IN PROGRESS  5.  Pt will return to highest level of function using LUE as non-dominant during functional task completion.   Goal status: IN  PROGRESS  ASSESSMENT:  CLINICAL IMPRESSION: Pt presenting to this session with improved ROM and good strength. She was able to tolerate adding 2lb dumbbells to strengthening this session and completed an entire round of therapy ball exercises. She required intermittent short 5-10 second rest breaks. Reviewed theraband exercises with good movements patterns demonstrated. OT providing verbal and tactile cuing for positioning and technique.    PERFORMANCE DEFICITS: in functional skills including ADLs, IADLs, ROM, strength, pain, fascial restrictions, and UE functional use   PLAN:  OT FREQUENCY: 2x/week  OT DURATION: 8 weeks  PLANNED INTERVENTIONS: self care/ADL training, therapeutic exercise, therapeutic activity, manual therapy, scar mobilization, passive range of motion, splinting, electrical stimulation, ultrasound, patient/family education, and DME and/or AE instructions  CONSULTED AND AGREED WITH PLAN OF CARE: Patient  PLAN FOR NEXT SESSION: A/ROM, Manual as needed, Scap strengthening, shoulder strengthening, Loopband exercises, Theraball exercises, Endurance based tasks   Trish Mage, OTR/L [phone] 12/17/2022, 9:36 AM

## 2022-12-19 ENCOUNTER — Telehealth (HOSPITAL_COMMUNITY): Payer: Self-pay | Admitting: Occupational Therapy

## 2022-12-19 ENCOUNTER — Encounter (HOSPITAL_COMMUNITY): Payer: No Typology Code available for payment source | Admitting: Occupational Therapy

## 2022-12-19 NOTE — Telephone Encounter (Signed)
This OT attempted to call patient regarding her missed appointment on 12/19/22 at 7:30am. Her mailbox was full and OT was unable to leave a message. Pt's next appointment is 12/29/22 at 9am.  Trish Mage, OTR/L [facility] [phone]

## 2022-12-29 ENCOUNTER — Ambulatory Visit (HOSPITAL_COMMUNITY): Payer: No Typology Code available for payment source | Admitting: Occupational Therapy

## 2022-12-29 ENCOUNTER — Telehealth (HOSPITAL_COMMUNITY): Payer: Self-pay | Admitting: Occupational Therapy

## 2022-12-29 NOTE — Telephone Encounter (Signed)
This OT called patient regarding her 2nd no show on 12/29/22 at 9 am. Pt's voicemail is full and OT is unable to leave a message. Pt has no further visits scheduled and will need to call the office for more appointments if needed.   Trish Mage, OTR/L [facility] [phone]

## 2023-02-09 NOTE — Therapy (Deleted)
[facility] at [facility] [address] [place], [place], [address] Phone: [phone]   Fax:  [phone]  February 09, 2023    No Recipients  Occupational Therapy Discharge Summary   Patient: Janalyn Burne MRN: [id number] Date of Birth: [birth year]  Diagnosis: Acute pain of left shoulder  Stiffness of left shoulder, not elsewhere classified  Other symptoms and signs involving the musculoskeletal system  No data recorded  The above patient had been seen in Occupational Therapy 14 times of 16 treatments scheduled.  The treatment consisted of Manual Therapy, ROM Exercises, and strengthening/stabilizing exercises.  The patient is: Improved  Functional Status at Discharge: Pt did not show for her last 2 scheduled visits. OT attempted to follow up with pt. Pt is now discharged from OT.   Natnael Biederman Bing Plume, OTR/L [facility] [phone] Tashi Andujo Rosemarie Beath, OT  CC No Recipients  [facility] at [facility] [address] [place], [place], [address] Phone: [phone]   Fax:  [phone]  Patient: Zenita Syfert MRN: [id number] Date of Birth: 1976-01-14

## 2023-10-02 ENCOUNTER — Encounter: Payer: Self-pay | Admitting: Emergency Medicine

## 2023-10-02 ENCOUNTER — Ambulatory Visit
Admission: EM | Admit: 2023-10-02 | Discharge: 2023-10-02 | Disposition: A | Discharge status: Home / Self Care | Attending: Family Medicine | Admitting: Family Medicine

## 2023-10-02 DIAGNOSIS — G43809 Other migraine, not intractable, without status migrainosus: Secondary | ICD-10-CM | POA: Diagnosis not present

## 2023-10-02 MED ORDER — KETOROLAC TROMETHAMINE 30 MG/ML IJ SOLN
30.0000 mg | Freq: Once | INTRAMUSCULAR | Status: AC
  Administered 2023-10-02: 30 mg via INTRAMUSCULAR

## 2023-10-02 MED ORDER — SUMATRIPTAN SUCCINATE 50 MG PO TABS: ORAL_TABLET | ORAL | 0 refills | Status: AC

## 2023-10-02 NOTE — Discharge Instructions (Signed)
 We have given you a shot of Toradol  today for your headache.  Avoid ibuprofen  type medications over-the-counter for the next 48 hours but you may continue taking Tylenol  as needed for breakthrough pain.  I have also prescribed a short-term prescription for Imitrex to be taken as prescribed as needed for headache resolution.  Follow-up for severely worsening symptoms at any time.

## 2023-10-02 NOTE — ED Triage Notes (Signed)
 Headache since Monday.  Has been taking zofran  and tylenol 

## 2023-10-02 NOTE — ED Provider Notes (Signed)
 [facility]    CSN: [id number] Arrival date & time: 10/02/23  0865      History   Chief Complaint No chief complaint on file.   HPI Lori Duncan is a 48 y.o. female.   Patient presenting today with 4-day history of migraine type headache worse on the left side.  States the headache eases off a bit with Tylenol , muscle relaxers and Zofran  but continues to come back.  Some light sensitivity and nausea associated and had an aura visually the first day that has resolved.  She states she has a long history of migraines previously on abortive medication and Topamax but has not needed these in about 5 years now.  Denies head injury, mental status changes, speech or swallowing changes, fever, chills, chest pain, shortness of breath.    Past Medical History:  Diagnosis Date   Asthma    Chlamydia 10/2009   LGSIL (low grade squamous intraepithelial dysplasia) 11/2010   Migraines    Smoker    STD (sexually transmitted disease)    Chlamydia    Patient Active Problem List   Diagnosis Date Noted   Smoker    Dysplasia of cervix     Past Surgical History:  Procedure Laterality Date   CERVICAL BIOPSY  W/ LOOP ELECTRODE EXCISION     COLPOSCOPY  11/2010   INTRAUTERINE DEVICE INSERTION  12/2009   LEEP  2002   SHOULDER SURGERY Right    TONSILLECTOMY AND ADENOIDECTOMY      OB History     Gravida  1   Para  1   Term  1   Preterm      AB      Living  1      SAB      IAB      Ectopic      Multiple      Live Births               Home Medications    Prior to Admission medications   Medication Sig Start Date End Date Taking? Authorizing Provider  SUMAtriptan (IMITREX) 50 MG tablet Take 1 tablet at onset of headache.  May repeat in 2 hours if headache persists or recurs.  Max of 2 tablets daily 10/02/23  Yes Corbin Dess, PA-C  albuterol  (VENTOLIN  HFA) 108 [phone] Base) MCG/ACT inhaler Inhale 2 puffs into the lungs every 4 (four) hours  as needed for wheezing or shortness of breath. 04/28/21   Adolph Hoop, PA-C  ibuprofen  (ADVIL ) 600 MG tablet Take 1 tablet (600 mg total) by mouth every 6 (six) hours as needed. 02/16/22   Mortenson, Ashley, MD  levocetirizine (XYZAL) 5 MG tablet Take 5 mg by mouth every evening.    [provider]  promethazine -dextromethorphan  (PROMETHAZINE -DM) 6.25-15 MG/5ML syrup Take 5 mLs by mouth 4 (four) times daily as needed. 04/14/22   Corbin Dess, PA-C  eletriptan (RELPAX) 40 MG tablet Take 1 tablet by mouth 2 (two) times daily as needed.  08/28/20  [provider]  metoCLOPramide  (REGLAN ) 10 MG tablet Take 1 tablet (10 mg total) by mouth every 6 (six) hours as needed for nausea. 10/20/18 08/28/20  Caccavale, Sophia, PA-C  montelukast (SINGULAIR) 10 MG tablet Take 1 tablet by mouth daily.  08/28/20  [provider]    Family History Family History  Problem Relation Age of Onset   Diabetes Mother    Hypertension Mother    Thyroid  disease Mother  Diabetes Father    Diabetes Paternal Grandmother    Hypertension Paternal Grandmother     Social History Social History   Tobacco Use   Smoking status: Former    Current packs/day: 0.50    Types: Cigarettes   Smokeless tobacco: Never  Vaping Use   Vaping status: Never Used  Substance Use Topics   Alcohol use: Yes    Comment: occassionally   Drug use: No    Types: Codeine     Allergies   Patient has no known allergies.   Review of Systems Review of Systems Per HPI  Physical Exam Triage Vital Signs ED Triage Vitals  Encounter Vitals Group     BP 10/02/23 1015 105/70     Systolic BP Percentile --      Diastolic BP Percentile --      Pulse Rate 10/02/23 1015 74     Resp 10/02/23 1015 18     Temp 10/02/23 1015 98.9 F (37.2 C)     Temp Source 10/02/23 1015 Oral     SpO2 10/02/23 1015 98 %     Weight --      Height --      Head Circumference --      Peak Flow --      Pain Score 10/02/23 1016  7     Pain Loc --      Pain Education --      Exclude from Growth Chart --    No data found.  Updated Vital Signs BP 105/70 (BP Location: Right Arm)   Pulse 74   Temp 98.9 F (37.2 C) (Oral)   Resp 18   LMP 09/25/2023 (Exact Date)   SpO2 98%   Visual Acuity Right Eye Distance:   Left Eye Distance:   Bilateral Distance:    Right Eye Near:   Left Eye Near:    Bilateral Near:     Physical Exam Vitals and nursing note reviewed.  Constitutional:      Appearance: Normal appearance. She is not ill-appearing.  HENT:     Head: Atraumatic.     Mouth/Throat:     Mouth: Mucous membranes are moist.  Eyes:     Extraocular Movements: Extraocular movements intact.     Conjunctiva/sclera: Conjunctivae normal.     Pupils: Pupils are equal, round, and reactive to light.  Cardiovascular:     Rate and Rhythm: Normal rate and regular rhythm.     Heart sounds: Normal heart sounds.  Pulmonary:     Effort: Pulmonary effort is normal.     Breath sounds: Normal breath sounds.  Musculoskeletal:        General: Normal range of motion.     Cervical back: Normal range of motion and neck supple.  Skin:    General: Skin is warm and dry.  Neurological:     Mental Status: She is alert and oriented to person, place, and time.     Cranial Nerves: No cranial nerve deficit.     Motor: No weakness.     Gait: Gait normal.  Psychiatric:        Mood and Affect: Mood normal.        Thought Content: Thought content normal.        Judgment: Judgment normal.      UC Treatments / Results  Labs (all labs ordered are listed, but only abnormal results are displayed) Labs Reviewed - No data to display  EKG   Radiology No results found.  Procedures Procedures (including critical care time)  Medications Ordered in UC Medications  ketorolac  (TORADOL ) 30 MG/ML injection 30 mg (30 mg Intramuscular Given 10/02/23 1133)    Initial Impression / Assessment and Plan / UC Course  I have reviewed the  triage vital signs and the nursing notes.  Pertinent labs & imaging results that were available during my care of the patient were reviewed by me and considered in my medical decision making (see chart for details).     Vitals and exam very reassuring today with no focal neurologic deficits.  Consistent with her previous migraines.  Will treat with IM Toradol , Imitrex, supportive over-the-counter medications and home care.  Return for any worsening symptoms. Work note given. Final Clinical Impressions(s) / UC Diagnoses   Final diagnoses:  Other migraine without status migrainosus, not intractable     Discharge Instructions      We have given you a shot of Toradol  today for your headache.  Avoid ibuprofen  type medications over-the-counter for the next 48 hours but you may continue taking Tylenol  as needed for breakthrough pain.  I have also prescribed a short-term prescription for Imitrex to be taken as prescribed as needed for headache resolution.  Follow-up for severely worsening symptoms at any time.  ED Prescriptions     Medication Sig Dispense Auth. Provider   SUMAtriptan (IMITREX) 50 MG tablet Take 1 tablet at onset of headache.  May repeat in 2 hours if headache persists or recurs.  Max of 2 tablets daily 10 tablet Corbin Dess, [place]      PDMP not reviewed this encounter.   Corbin Dess, [place] 10/02/23 1226
